# Patient Record
Sex: Male | Born: 1965 | Race: White | Hispanic: No | Smoking: Former smoker
Health system: Southern US, Community
[De-identification: ages and names within clinical notes are randomized; demographics above are authoritative.]

## PROBLEM LIST (undated history)

## (undated) DIAGNOSIS — I10 Essential (primary) hypertension: Secondary | ICD-10-CM

## (undated) DIAGNOSIS — Z860100 Personal history of colon polyps, unspecified: Secondary | ICD-10-CM

## (undated) DIAGNOSIS — K760 Fatty (change of) liver, not elsewhere classified: Secondary | ICD-10-CM

## (undated) DIAGNOSIS — A630 Anogenital (venereal) warts: Secondary | ICD-10-CM

## (undated) DIAGNOSIS — N189 Chronic kidney disease, unspecified: Secondary | ICD-10-CM

## (undated) DIAGNOSIS — G43909 Migraine, unspecified, not intractable, without status migrainosus: Secondary | ICD-10-CM

## (undated) DIAGNOSIS — J40 Bronchitis, not specified as acute or chronic: Secondary | ICD-10-CM

## (undated) DIAGNOSIS — M1990 Unspecified osteoarthritis, unspecified site: Secondary | ICD-10-CM

## (undated) DIAGNOSIS — Z86718 Personal history of other venous thrombosis and embolism: Secondary | ICD-10-CM

## (undated) DIAGNOSIS — T884XXA Failed or difficult intubation, initial encounter: Secondary | ICD-10-CM

## (undated) DIAGNOSIS — E119 Type 2 diabetes mellitus without complications: Secondary | ICD-10-CM

## (undated) HISTORY — DX: Essential (primary) hypertension: I10

## (undated) HISTORY — DX: Anogenital (venereal) warts: A63.0

## (undated) HISTORY — DX: Fatty (change of) liver, not elsewhere classified: K76.0

## (undated) HISTORY — DX: Personal history of colonic polyps: Z86.010

## (undated) HISTORY — PX: POLYPECTOMY: SHX149

## (undated) HISTORY — PX: COLONOSCOPY: SHX174

## (undated) HISTORY — DX: Personal history of colon polyps, unspecified: Z86.0100

## (undated) HISTORY — PX: WISDOM TOOTH EXTRACTION: SHX21

## (undated) HISTORY — DX: Migraine, unspecified, not intractable, without status migrainosus: G43.909

## (undated) HISTORY — DX: Chronic kidney disease, unspecified: N18.9

## (undated) HISTORY — DX: Personal history of other venous thrombosis and embolism: Z86.718

## (undated) HISTORY — DX: Bronchitis, not specified as acute or chronic: J40

## (undated) HISTORY — DX: Type 2 diabetes mellitus without complications: E11.9

## (undated) HISTORY — DX: Unspecified osteoarthritis, unspecified site: M19.90

---

## 2000-10-08 ENCOUNTER — Encounter: Payer: Self-pay | Admitting: Emergency Medicine

## 2000-10-08 ENCOUNTER — Emergency Department (HOSPITAL_COMMUNITY): Admission: EM | Admit: 2000-10-08 | Discharge: 2000-10-08 | Payer: Self-pay | Admitting: Emergency Medicine

## 2001-03-15 ENCOUNTER — Emergency Department (HOSPITAL_COMMUNITY): Admission: EM | Admit: 2001-03-15 | Discharge: 2001-03-15 | Payer: Self-pay | Admitting: Emergency Medicine

## 2001-03-15 ENCOUNTER — Encounter: Payer: Self-pay | Admitting: Emergency Medicine

## 2001-03-17 ENCOUNTER — Emergency Department (HOSPITAL_COMMUNITY): Admission: EM | Admit: 2001-03-17 | Discharge: 2001-03-17 | Payer: Self-pay | Admitting: Emergency Medicine

## 2001-03-23 ENCOUNTER — Encounter: Payer: Self-pay | Admitting: Emergency Medicine

## 2001-03-23 ENCOUNTER — Emergency Department (HOSPITAL_COMMUNITY): Admission: EM | Admit: 2001-03-23 | Discharge: 2001-03-23 | Payer: Self-pay | Admitting: Emergency Medicine

## 2002-11-04 ENCOUNTER — Emergency Department (HOSPITAL_COMMUNITY): Admission: EM | Admit: 2002-11-04 | Discharge: 2002-11-04 | Payer: Self-pay | Admitting: Emergency Medicine

## 2002-11-04 ENCOUNTER — Encounter: Payer: Self-pay | Admitting: Emergency Medicine

## 2003-11-30 ENCOUNTER — Encounter: Admission: RE | Admit: 2003-11-30 | Discharge: 2003-11-30 | Payer: Self-pay | Admitting: Rheumatology

## 2004-09-16 ENCOUNTER — Ambulatory Visit: Payer: Self-pay | Admitting: Family Medicine

## 2005-06-26 ENCOUNTER — Ambulatory Visit: Payer: Self-pay | Admitting: Family Medicine

## 2005-08-04 ENCOUNTER — Ambulatory Visit: Payer: Self-pay | Admitting: Family Medicine

## 2005-08-18 ENCOUNTER — Ambulatory Visit: Payer: Self-pay | Admitting: Family Medicine

## 2005-08-31 ENCOUNTER — Ambulatory Visit: Payer: Self-pay | Admitting: Gastroenterology

## 2005-09-03 ENCOUNTER — Ambulatory Visit: Payer: Self-pay | Admitting: Family Medicine

## 2005-09-03 ENCOUNTER — Encounter: Admission: RE | Admit: 2005-09-03 | Discharge: 2005-09-03 | Payer: Self-pay | Admitting: Family Medicine

## 2005-09-08 ENCOUNTER — Ambulatory Visit: Payer: Self-pay

## 2005-09-11 ENCOUNTER — Ambulatory Visit: Payer: Self-pay | Admitting: Gastroenterology

## 2005-09-11 ENCOUNTER — Encounter (INDEPENDENT_AMBULATORY_CARE_PROVIDER_SITE_OTHER): Payer: Self-pay | Admitting: *Deleted

## 2005-10-20 ENCOUNTER — Ambulatory Visit: Payer: Self-pay | Admitting: *Deleted

## 2005-10-23 ENCOUNTER — Ambulatory Visit: Payer: Self-pay | Admitting: Family Medicine

## 2005-11-06 ENCOUNTER — Ambulatory Visit: Payer: Self-pay | Admitting: Family Medicine

## 2005-11-13 ENCOUNTER — Ambulatory Visit: Payer: Self-pay

## 2006-01-26 ENCOUNTER — Ambulatory Visit: Payer: Self-pay | Admitting: *Deleted

## 2006-01-27 ENCOUNTER — Ambulatory Visit: Payer: Self-pay

## 2006-01-28 ENCOUNTER — Ambulatory Visit (HOSPITAL_COMMUNITY): Admission: RE | Admit: 2006-01-28 | Discharge: 2006-01-28 | Payer: Self-pay | Admitting: Cardiovascular Disease

## 2006-12-14 ENCOUNTER — Ambulatory Visit: Payer: Self-pay | Admitting: Family Medicine

## 2006-12-21 ENCOUNTER — Ambulatory Visit: Payer: Self-pay | Admitting: Family Medicine

## 2006-12-21 LAB — CONVERTED CEMR LAB
ALT: 50 units/L — ABNORMAL HIGH (ref 0–40)
AST: 25 units/L (ref 0–37)
Alkaline Phosphatase: 63 units/L (ref 39–117)
BUN: 9 mg/dL (ref 6–23)
CO2: 29 meq/L (ref 19–32)
Calcium: 9.5 mg/dL (ref 8.4–10.5)
Creatinine, Ser: 1 mg/dL (ref 0.4–1.5)
Potassium: 4.3 meq/L (ref 3.5–5.1)
Total Bilirubin: 1.1 mg/dL (ref 0.3–1.2)
Total Protein: 7 g/dL (ref 6.0–8.3)

## 2006-12-22 ENCOUNTER — Encounter: Payer: Self-pay | Admitting: Family Medicine

## 2010-06-05 ENCOUNTER — Emergency Department (HOSPITAL_COMMUNITY): Admission: EM | Admit: 2010-06-05 | Discharge: 2010-06-05 | Payer: Self-pay | Admitting: Family Medicine

## 2010-09-02 ENCOUNTER — Encounter: Payer: Self-pay | Admitting: Gastroenterology

## 2010-11-04 NOTE — Letter (Signed)
Summary: Colonoscopy Letter  [facility]  [address] [place], [place] [address]   Phone: [phone]  Fax: [phone]      September 02, 2010 MRN: [id number]   Topher Eichel JR [address] [place], [place]  [address]   Dear Mr. TUCKER MINTER,   According to your medical record, it is time for you to schedule a Colonoscopy. The American Cancer Society recommends this procedure as a method to detect early colon cancer. Patients with a family history of colon cancer, or a personal history of colon polyps or inflammatory bowel disease are at increased risk.  This letter has been generated based on the recommendations made at the time of your procedure. If you feel that in your particular situation this may no longer apply, please contact our office.  Please call our office at [phone] to schedule this appointment or to update your records at your earliest convenience.  Thank you for cooperating with [place] to provide you with the very best care possible.   Sincerely,  Barbette Hair. Arlyce Dice, M.D.  [facility] [phone]

## 2012-06-08 ENCOUNTER — Encounter: Payer: Self-pay | Admitting: Gastroenterology

## 2012-07-25 ENCOUNTER — Encounter: Payer: Self-pay | Admitting: Gastroenterology

## 2012-08-25 ENCOUNTER — Encounter: Payer: Self-pay | Admitting: Gastroenterology

## 2012-08-25 ENCOUNTER — Ambulatory Visit (AMBULATORY_SURGERY_CENTER): Payer: 59 | Admitting: *Deleted

## 2012-08-25 VITALS — Ht 71.0 in | Wt 250.0 lb

## 2012-08-25 DIAGNOSIS — Z1211 Encounter for screening for malignant neoplasm of colon: Secondary | ICD-10-CM

## 2012-08-25 DIAGNOSIS — Z800 Family history of malignant neoplasm of digestive organs: Secondary | ICD-10-CM

## 2012-08-25 DIAGNOSIS — Z860100 Personal history of colon polyps, unspecified: Secondary | ICD-10-CM

## 2012-08-25 MED ORDER — NA SULFATE-K SULFATE-MG SULF 17.5-3.13-1.6 GM/177ML PO SOLN: 1.0000 | Freq: Once | ORAL | Status: DC

## 2012-08-25 NOTE — Progress Notes (Signed)
No egg or soy allergy. ewm 

## 2012-09-15 ENCOUNTER — Ambulatory Visit (AMBULATORY_SURGERY_CENTER): Payer: 59 | Admitting: Gastroenterology

## 2012-09-15 ENCOUNTER — Encounter: Payer: Self-pay | Admitting: Gastroenterology

## 2012-09-15 VITALS — BP 142/92 | HR 69 | Temp 97.1°F | Resp 13 | Ht 71.0 in | Wt 250.0 lb

## 2012-09-15 DIAGNOSIS — D126 Benign neoplasm of colon, unspecified: Secondary | ICD-10-CM

## 2012-09-15 DIAGNOSIS — Z800 Family history of malignant neoplasm of digestive organs: Secondary | ICD-10-CM

## 2012-09-15 DIAGNOSIS — Z86010 Personal history of colonic polyps: Secondary | ICD-10-CM

## 2012-09-15 DIAGNOSIS — Z1211 Encounter for screening for malignant neoplasm of colon: Secondary | ICD-10-CM

## 2012-09-15 MED ORDER — SODIUM CHLORIDE 0.9 % IV SOLN: 500.0000 mL | INTRAVENOUS | Status: DC

## 2012-09-15 NOTE — Progress Notes (Signed)
Called to room to assist during endoscopic procedure.  Patient ID and intended procedure confirmed with present staff. Received instructions for my participation in the procedure from the performing physician. ewm 

## 2012-09-15 NOTE — Progress Notes (Signed)
Patient did not experience any of the following events: a burn prior to discharge; a fall within the facility; wrong site/side/patient/procedure/implant event; or a hospital transfer or hospital admission upon discharge from the facility. (G8907) Patient did not have preoperative order for IV antibiotic SSI prophylaxis. ([id number])  

## 2012-09-15 NOTE — Patient Instructions (Addendum)

## 2012-09-15 NOTE — Progress Notes (Signed)
1610 a/ox3 pleased report to [facility]

## 2012-09-15 NOTE — Op Note (Signed)
[facility] [address] [place], [address]   COLONOSCOPY PROCEDURE REPORT  PATIENT: Jeremy Dean, Jeremy Dean  MR#: [id number] BIRTHDATE: [birth year] , 46  yrs. old GENDER: Male ENDOSCOPIST: Louis Meckel, MD REFERRED BY: PROCEDURE DATE:  09/15/2012 PROCEDURE:   Colonoscopy with snare polypectomy ASA CLASS:   Class II INDICATIONS:Patient's immediate family history of colon cancer. MEDICATIONS: MAC sedation, administered by CRNA and Propofol (Diprivan) 340 mg IV  DESCRIPTION OF PROCEDURE:   After the risks benefits and alternatives of the procedure were thoroughly explained, informed consent was obtained.  A digital rectal exam revealed no abnormalities of the rectum.   The LB [id number]  endoscope was introduced through the anus and advanced to the cecum, which was identified by both the appendix and ileocecal valve. No adverse events experienced.   The quality of the prep was Suprep excellent The instrument was then slowly withdrawn as the colon was fully examined.      COLON FINDINGS: A sessile polyp measuring 5 mm in size was found in the ascending colon.  A polypectomy was performed with a cold snare.  The resection was complete and the polyp tissue was completely retrieved.   Two sessile polyps ranging between 3-57mm in size were found in the sigmoid colon.  A polypectomy was performed with a cold snare.  The resection was complete and the polyp tissue was completely retrieved.   The colon mucosa was otherwise normal. Retroflexed views revealed no abnormalities. The time to cecum=2 minutes 24 seconds.  Withdrawal time=10 minutes 0 seconds.  The scope was withdrawn and the procedure completed. COMPLICATIONS: There were no complications.  ENDOSCOPIC IMPRESSION: 1.   Sessile polyp measuring 5 mm in size was found in the ascending colon; polypectomy was performed with a cold snare 2.   Two sessile polyps ranging between 3-26mm in size were found in the  sigmoid colon; polypectomy was performed with a cold snare 3.   The colon mucosa was otherwise normal  RECOMMENDATIONS: Given your significant family history of colon cancer, you should have a repeat colonoscopy in 5 years   eSigned:  Louis Meckel, MD 09/15/2012 8:43 AM   cc: Lelon Perla, DO and Bernadette Hoit, MD   PATIENT NAME:  Jeremy Dean, Jeremy Dean MR#: [id number]

## 2012-09-16 ENCOUNTER — Telehealth: Payer: Self-pay

## 2012-09-16 NOTE — Telephone Encounter (Signed)
  Follow up Call-  Call back number 09/15/2012  Post procedure Call Back phone  # [phone]  Permission to leave phone message Yes     Patient questions:  Do you have a fever, pain , or abdominal swelling? no Pain Score  0 *  Have you tolerated food without any problems? yes  Have you been able to return to your normal activities? yes  Do you have any questions about your discharge instructions: Diet   no Medications  no Follow up visit  no  Do you have questions or concerns about your Care? no  Actions: * If pain score is 4 or above: No action needed, pain <4.

## 2012-09-21 ENCOUNTER — Encounter: Payer: Self-pay | Admitting: Gastroenterology

## 2013-08-04 ENCOUNTER — Telehealth: Payer: Self-pay

## 2013-08-04 NOTE — Telephone Encounter (Addendum)
Left message for call back  identifiable  [facility]--2013--Dr Kaplan--next due 2018--FH Unable to reach prior to visit

## 2013-08-07 ENCOUNTER — Ambulatory Visit (INDEPENDENT_AMBULATORY_CARE_PROVIDER_SITE_OTHER): Payer: Managed Care, Other (non HMO) | Admitting: Family Medicine

## 2013-08-07 ENCOUNTER — Encounter: Payer: Self-pay | Admitting: Family Medicine

## 2013-08-07 VITALS — BP 128/82 | HR 75 | Temp 98.8°F | Ht 70.5 in | Wt 233.0 lb

## 2013-08-07 DIAGNOSIS — Z0000 Encounter for general adult medical examination without abnormal findings: Secondary | ICD-10-CM

## 2013-08-07 DIAGNOSIS — M25559 Pain in unspecified hip: Secondary | ICD-10-CM

## 2013-08-07 DIAGNOSIS — M25551 Pain in right hip: Secondary | ICD-10-CM

## 2013-08-07 DIAGNOSIS — E669 Obesity, unspecified: Secondary | ICD-10-CM | POA: Insufficient documentation

## 2013-08-07 MED ORDER — MELOXICAM 15 MG PO TABS: 15.0000 mg | ORAL_TABLET | Freq: Every day | ORAL | Status: DC

## 2013-08-07 NOTE — Progress Notes (Signed)
Subjective:    Patient ID: Jeremy Dean, male    DOB: 1966/03/05, 47 y.o.   MRN: [id number]  HPI Pt here for cpe and is c/o R  Hip pain x 9 months.  Any running , basketball etc aggravates it.  Stretching helps but not completely.  Pt has taken Ibuprofen before playing and it helps some.     Review of Systems Review of Systems  Constitutional: Negative for activity change, appetite change and fatigue.  HENT: Negative for hearing loss, congestion, tinnitus and ear discharge.  dentist q 1-2 y Eyes: Negative for visual disturbance (see opth--   Due).  Respiratory: Negative for cough, chest tightness and shortness of breath.   Cardiovascular: Negative for chest pain, palpitations and leg swelling.  Gastrointestinal: Negative for abdominal pain, diarrhea, constipation and abdominal distention.  Genitourinary: Negative for urgency, frequency, decreased urine volume and difficulty urinating.  Musculoskeletal: Negative for back pain, arthralgias and gait problem.  Skin: Negative for color change, pallor and rash.  Neurological: Negative for dizziness, light-headedness, numbness and headaches.  Hematological: Negative for adenopathy. Does not bruise/bleed easily.  Psychiatric/Behavioral: Negative for suicidal ideas, confusion, sleep disturbance, self-injury, dysphoric mood, decreased concentration and agitation.   Past Medical History  Diagnosis Date  . Hx of colonic polyps   . Hypertension   . Chronic kidney disease     kidney stone  . History of blood clots     left leg after fracture 20+ years ago  . Genital warts     Hx of Genital warts   History  Substance Use Topics  . Smoking status: Former Games developer  . Smokeless tobacco: Never Used  . Alcohol Use: Yes     Comment: socially   Family History  Problem Relation Age of Onset  . Colon cancer Father   . Esophageal cancer Father   . Breast cancer Maternal Aunt   . Colon cancer Maternal Uncle   . Rectal cancer Neg Hx   . Stomach  cancer Neg Hx   . Lung cancer Paternal Grandfather   . Breast cancer Paternal Aunt   . Stroke Maternal Grandfather    Current outpatient prescriptions:ibuprofen (ADVIL,MOTRIN) 200 MG tablet, Take 200 mg by mouth every 6 (six) hours as needed., Disp: , Rfl:  Past Surgical History  Procedure Laterality Date  . Wisdom tooth extraction    . Colonoscopy    . Polypectomy           Objective:   Physical Exam  BP 128/82  Pulse 75  Temp(Src) 98.8 F (37.1 C) (Oral)  Ht 5' 10.5" (1.791 m)  Wt 233 lb (105.688 kg)  BMI 32.95 kg/m2  SpO2 96% General appearance: alert, cooperative, appears stated age and no distress Head: Normocephalic, without obvious abnormality, atraumatic Eyes: conjunctivae/corneas clear. PERRL, EOM's intact. Fundi benign. Ears: normal TM's and external ear canals both ears Nose: Nares normal. Septum midline. Mucosa normal. No drainage or sinus tenderness. Throat: lips, mucosa, and tongue normal; teeth and gums normal Neck: no adenopathy, no carotid bruit, no JVD, supple, symmetrical, trachea midline and thyroid not enlarged, symmetric, no tenderness/mass/nodules Back: symmetric, no curvature. ROM normal. No CVA tenderness. Lungs: clear to auscultation bilaterally Chest wall: no tenderness Heart: regular rate and rhythm, S1, S2 normal, no murmur, click, rub or gallop Abdomen: soft, non-tender; bowel sounds normal; no masses,  no organomegaly Male genitalia: normal Rectal: normal tone, normal prostate, no masses or tenderness and soft brown guaiac negative stool noted Extremities: extremities normal, atraumatic, no  cyanosis or edema Pulses: 2+ and symmetric Skin: Skin color, texture, turgor normal. No rashes or lesions Lymph nodes: Cervical, supraclavicular, and axillary nodes normal. Neurologic: Alert and oriented X 3, normal strength and tone. Normal symmetric reflexes. Normal coordination and gait Psych- no depression, no anxiety       Assessment & Plan:   cpe-- ghm utc          Check labs          See avs

## 2013-08-07 NOTE — Patient Instructions (Signed)
Preventive Care for Adults, Male  A healthy lifestyle and preventive care can promote health and wellness. Preventive health guidelines for men include the following key practices:  · A routine yearly physical is a good way to check with your caregiver about your health and preventative screening. It is a chance to share any concerns and updates on your health, and to receive a thorough exam.  · Visit your dentist for a routine exam and preventative care every 6 months. Brush your teeth twice a day and floss once a day. Good oral hygiene prevents tooth decay and gum disease.  · The frequency of eye exams is based on your age, health, family medical history, use of contact lenses, and other factors. Follow your caregiver's recommendations for frequency of eye exams.  · Eat a healthy diet. Foods like vegetables, fruits, whole grains, low-fat dairy products, and lean protein foods contain the nutrients you need without too many calories. Decrease your intake of foods high in solid fats, added sugars, and salt. Eat the right amount of calories for you. Get information about a proper diet from your caregiver, if necessary.  · Regular physical exercise is one of the most important things you can do for your health. Most adults should get at least 150 minutes of moderate-intensity exercise (any activity that increases your heart rate and causes you to sweat) each week. In addition, most adults need muscle-strengthening exercises on 2 or more days a week.  · Maintain a healthy weight. The body mass index (BMI) is a screening tool to identify possible weight problems. It provides an estimate of body fat based on height and weight. Your caregiver can help determine your BMI, and can help you achieve or maintain a healthy weight. For adults 20 years and older:  · A BMI below 18.5 is considered underweight.  · A BMI of 18.5 to 24.9 is normal.  · A BMI of 25 to 29.9 is considered overweight.  · A BMI of 30 and above is  considered obese.  · Maintain normal blood lipids and cholesterol levels by exercising and minimizing your intake of saturated fat. Eat a balanced diet with plenty of fruit and vegetables. Blood tests for lipids and cholesterol should begin at age 20 and be repeated every 5 years. If your lipid or cholesterol levels are high, you are over 50, or you are a high risk for heart disease, you may need your cholesterol levels checked more frequently. Ongoing high lipid and cholesterol levels should be treated with medicines if diet and exercise are not effective.  · If you smoke, find out from your caregiver how to quit. If you do not use tobacco, do not start.  · If you choose to drink alcohol, do not exceed 2 drinks per day. One drink is considered to be 12 ounces (355 mL) of beer, 5 ounces (148 mL) of wine, or 1.5 ounces (44 mL) of liquor.  · Avoid use of street drugs. Do not share needles with anyone. Ask for help if you need support or instructions about stopping the use of drugs.  · High blood pressure causes heart disease and increases the risk of stroke. Your blood pressure should be checked at least every 1 to 2 years. Ongoing high blood pressure should be treated with medicines, if weight loss and exercise are not effective.  · If you are 45 to 47 years old, ask your caregiver if you should take aspirin to prevent heart disease.  · Diabetes screening involves taking   a blood sample to check your fasting blood sugar level. This should be done once every 3 years, after age 45, if you are within normal weight and without risk factors for diabetes. Testing should be considered at a younger age or be carried out more frequently if you are overweight and have at least 1 risk factor for diabetes.  · Colorectal cancer can be detected and often prevented. Most routine colorectal cancer screening begins at the age of 50 and continues through age 75. However, your caregiver may recommend screening at an earlier age if you  have risk factors for colon cancer. On a yearly basis, your caregiver may provide home test kits to check for hidden blood in the stool. Use of a small camera at the end of a tube, to directly examine the colon (sigmoidoscopy or colonoscopy), can detect the earliest forms of colorectal cancer. Talk to your caregiver about this at age 50, when routine screening begins.  Direct examination of the colon should be repeated every 5 to 10 years through age 75, unless early forms of pre-cancerous polyps or small growths are found.  · Hepatitis C blood testing is recommended for all people born from 1945 through 1965 and any individual with known risks for hepatitis C.  · Practice safe sex. Use condoms and avoid high-risk sexual practices to reduce the spread of sexually transmitted infections (STIs). STIs include gonorrhea, chlamydia, syphilis, trichomonas, herpes, HPV, and human immunodeficiency virus (HIV). Herpes, HIV, and HPV are viral illnesses that have no cure. They can result in disability, cancer, and death.  · A one-time screening for abdominal aortic aneurysm (AAA) and surgical repair of large AAAs by sound wave imaging (ultrasonography) is recommended for ages 65 to 75 years who are current or former smokers.  · Healthy men should no longer receive prostate-specific antigen (PSA) blood tests as part of routine cancer screening. Consult with your caregiver about prostate cancer screening.  · Testicular cancer screening is not recommended for adult males who have no symptoms. Screening includes self-exam, caregiver exam, and other screening tests. Consult with your caregiver about any symptoms you have or any concerns you have about testicular cancer.  · Use sunscreen with skin protection factor (SPF) of 30 or more. Apply sunscreen liberally and repeatedly throughout the day. You should seek shade when your shadow is shorter than you. Protect yourself by wearing long sleeves, pants, a wide-brimmed hat, and  sunglasses year round, whenever you are outdoors.  · Once a month, do a whole body skin exam, using a mirror to look at the skin on your back. Notify your caregiver of new moles, moles that have irregular borders, moles that are larger than a pencil eraser, or moles that have changed in shape or color.  · Stay current with required immunizations.  · Influenza. You need a dose every fall (or winter). The composition of the flu vaccine changes each year, so being vaccinated once is not enough.  · Pneumococcal polysaccharide. You need 1 to 2 doses if you smoke cigarettes or if you have certain chronic medical conditions. You need 1 dose at age 65 (or older) if you have never been vaccinated.  · Tetanus, diphtheria, pertussis (Tdap, Td). Get 1 dose of Tdap vaccine if you are younger than age 65 years, are over 65 and have contact with an infant, are a healthcare worker, or simply want to be protected from whooping cough. After that, you need a Td booster dose every 10 years. Consult your   caregiver if you have not had at least 3 tetanus and diphtheria-containing shots sometime in your life or have a deep or dirty wound.  · HPV. This vaccine is recommended for males 13 through 47 years of age. This vaccine may be given to men 22 through 47 years of age who have not completed the 3 dose series. It is recommended for men through age 26 who have sex with men or whose immune system is weakened because of HIV infection, other illness, or medications. The vaccine is given in 3 doses over 6 months.  · Measles, mumps, rubella (MMR). You need at least 1 dose of MMR if you were born in 1957 or later. You may also need a 2nd dose.  · Meningococcal. If you are age 19 to 21 years and a first-year college student living in a residence hall, or have one of several medical conditions, you need to get vaccinated against meningococcal disease. You may also need additional booster doses.  · Zoster (shingles). If you are age 60 years or  older, you should get this vaccine.  · Varicella (chickenpox). If you have never had chickenpox or you were vaccinated but received only 1 dose, talk to your caregiver to find out if you need this vaccine.  · Hepatitis A. You need this vaccine if you have a specific risk factor for hepatitis A virus infection, or you simply wish to be protected from this disease. The vaccine is usually given as 2 doses, 6 to 18 months apart.  · Hepatitis B. You need this vaccine if you have a specific risk factor for hepatitis B virus infection or you simply wish to be protected from this disease. The vaccine is given in 3 doses, usually over 6 months.  Preventative Service / Frequency  Ages 19 to 39  · Blood pressure check.** / Every 1 to 2 years.  · Lipid and cholesterol check.** / Every 5 years beginning at age 20.  · Hepatitis C blood test.** / For any individual with known risks for hepatitis C.  · Skin self-exam. / Monthly.  · Influenza immunization.** / Every year.  · Pneumococcal polysaccharide immunization.** / 1 to 2 doses if you smoke cigarettes or if you have certain chronic medical conditions.  · Tetanus, diphtheria, pertussis (Tdap,Td) immunization. / A one-time dose of Tdap vaccine. After that, you need a Td booster dose every 10 years.  · HPV immunization. / 3 doses over 6 months, if 26 and younger.  · Measles, mumps, rubella (MMR) immunization. / You need at least 1 dose of MMR if you were born in 1957 or later. You may also need a 2nd dose.  · Meningococcal immunization. / 1 dose if you are age 19 to 21 years and a first-year college student living in a residence hall, or have one of several medical conditions, you need to get vaccinated against meningococcal disease. You may also need additional booster doses.  · Varicella immunization.** / Consult your caregiver.  · Hepatitis A immunization.** / Consult your caregiver. 2 doses, 6 to 18 months apart.  · Hepatitis B immunization.** / Consult your caregiver. 3 doses  usually over 6 months.  Ages 40 to 64  · Blood pressure check.** / Every 1 to 2 years.  · Lipid and cholesterol check.** / Every 5 years beginning at age 20.  · Fecal occult blood test (FOBT) of stool. / Every year beginning at age 50 and continuing until age 75. You may not have   to do this test if you get colonoscopy every 10 years.  · Flexible sigmoidoscopy** or colonoscopy.** / Every 5 years for a flexible sigmoidoscopy or every 10 years for a colonoscopy beginning at age 50 and continuing until age 75.  · Hepatitis C blood test.** / For all people born from 1945 through 1965 and any individual with known risks for hepatitis C.  · Skin self-exam. / Monthly.  · Influenza immunization.** / Every year.  · Pneumococcal polysaccharide immunization.** / 1 to 2 doses if you smoke cigarettes or if you have certain chronic medical conditions.  · Tetanus, diphtheria, pertussis (Tdap/Td) immunization.** / A one-time dose of Tdap vaccine. After that, you need a Td booster dose every 10 years.  · Measles, mumps, rubella (MMR) immunization.  / You need at least 1 dose of MMR if you were born in 1957 or later. You may also need a 2nd dose.  · Varicella immunization.**/ Consult your caregiver.  · Meningococcal immunization.** / Consult your caregiver.  · Hepatitis A immunization.** / Consult your caregiver. 2 doses, 6 to 18 months apart.  · Hepatitis B immunization.** / Consult your caregiver. 3 doses, usually over 6 months.  Ages 65 and over  · Blood pressure check.** / Every 1 to 2 years.  · Lipid and cholesterol check.**/ Every 5 years beginning at age 20.  · Fecal occult blood test (FOBT) of stool. / Every year beginning at age 50 and continuing until age 75. You may not have to do this test if you get colonoscopy every 10 years.  · Flexible sigmoidoscopy** or colonoscopy.** / Every 5 years for a flexible sigmoidoscopy or every 10 years for a colonoscopy beginning at age 50 and continuing until age 75.  · Hepatitis C blood  test.** / For all people born from 1945 through 1965 and any individual with known risks for hepatitis C.  · Abdominal aortic aneurysm (AAA) screening.** / A one-time screening for ages 65 to 75 years who are current or former smokers.  · Skin self-exam. / Monthly.  · Influenza immunization.** / Every year.  · Pneumococcal polysaccharide immunization.** / 1 dose at age 65 (or older) if you have never been vaccinated.  · Tetanus, diphtheria, pertussis (Tdap, Td) immunization. / A one-time dose of Tdap vaccine if you are over 65 and have contact with an infant, are a healthcare worker, or simply want to be protected from whooping cough. After that, you need a Td booster dose every 10 years.  · Varicella immunization. ** / Consult your caregiver.  · Meningococcal immunization.** / Consult your caregiver.  · Hepatitis A immunization. ** / Consult your caregiver. 2 doses, 6 to 18 months apart.  · Hepatitis B immunization.** / Check with your caregiver. 3 doses, usually over 6 months.  **Family history and personal history of risk and conditions may change your caregiver's recommendations.  Document Released: 11/17/2001 Document Revised: 12/14/2011 Document Reviewed: 02/16/2011  [employer]® Patient Information ©2014 [employer].

## 2013-08-07 NOTE — Assessment & Plan Note (Signed)
R hip pain-- mobic 15 mg qd  If no improvement --- refer to sports med

## 2013-08-08 ENCOUNTER — Other Ambulatory Visit (INDEPENDENT_AMBULATORY_CARE_PROVIDER_SITE_OTHER): Payer: Managed Care, Other (non HMO)

## 2013-08-08 DIAGNOSIS — Z0000 Encounter for general adult medical examination without abnormal findings: Secondary | ICD-10-CM

## 2013-08-08 LAB — HEPATIC FUNCTION PANEL
AST: 17 U/L (ref 0–37)
Alkaline Phosphatase: 65 U/L (ref 39–117)
Total Bilirubin: 1.1 mg/dL (ref 0.3–1.2)

## 2013-08-08 LAB — CBC WITH DIFFERENTIAL/PLATELET
Basophils Absolute: 0.1 10*3/uL (ref 0.0–0.1)
Eosinophils Absolute: 0.1 10*3/uL (ref 0.0–0.7)
Lymphocytes Relative: 27.2 % (ref 12.0–46.0)
Monocytes Relative: 8.5 % (ref 3.0–12.0)
Platelets: 213 10*3/uL (ref 150.0–400.0)
RDW: 12.7 % (ref 11.5–14.6)

## 2013-08-08 LAB — BASIC METABOLIC PANEL
BUN: 12 mg/dL (ref 6–23)
Calcium: 9.2 mg/dL (ref 8.4–10.5)
Creatinine, Ser: 1.1 mg/dL (ref 0.4–1.5)
GFR: 76.12 mL/min (ref >60.00)
Glucose, Bld: 96 mg/dL (ref 70–99)

## 2013-08-08 LAB — PSA: PSA: 1.13 ng/mL (ref 0.10–4.00)

## 2013-08-08 LAB — LIPID PANEL
Cholesterol: 175 mg/dL (ref 0–200)
HDL: 34.8 mg/dL — ABNORMAL LOW (ref >39.00)
LDL Cholesterol: 109 mg/dL — ABNORMAL HIGH (ref 0–99)
VLDL: 30.8 mg/dL (ref 0.0–40.0)

## 2013-08-09 LAB — POCT URINALYSIS DIPSTICK
Protein, UA: NEGATIVE
Spec Grav, UA: 1.02
Urobilinogen, UA: 0.2
pH, UA: 6.5

## 2013-09-18 ENCOUNTER — Other Ambulatory Visit: Payer: Self-pay | Admitting: Family Medicine

## 2013-09-18 ENCOUNTER — Encounter: Payer: Self-pay | Admitting: Family Medicine

## 2013-09-18 DIAGNOSIS — M25551 Pain in right hip: Secondary | ICD-10-CM

## 2013-11-02 ENCOUNTER — Emergency Department (HOSPITAL_COMMUNITY): Payer: Managed Care, Other (non HMO)

## 2013-11-02 ENCOUNTER — Encounter (HOSPITAL_COMMUNITY): Payer: Self-pay | Admitting: Emergency Medicine

## 2013-11-02 ENCOUNTER — Emergency Department (HOSPITAL_COMMUNITY)
Admission: EM | Admit: 2013-11-02 | Discharge: 2013-11-02 | Disposition: A | Discharge status: Home / Self Care | Payer: Managed Care, Other (non HMO) | Attending: Emergency Medicine | Admitting: Emergency Medicine

## 2013-11-02 DIAGNOSIS — S298XXA Other specified injuries of thorax, initial encounter: Secondary | ICD-10-CM | POA: Insufficient documentation

## 2013-11-02 DIAGNOSIS — IMO0002 Reserved for concepts with insufficient information to code with codable children: Secondary | ICD-10-CM | POA: Insufficient documentation

## 2013-11-02 DIAGNOSIS — I129 Hypertensive chronic kidney disease with stage 1 through stage 4 chronic kidney disease, or unspecified chronic kidney disease: Secondary | ICD-10-CM | POA: Insufficient documentation

## 2013-11-02 DIAGNOSIS — Y92838 Other recreation area as the place of occurrence of the external cause: Secondary | ICD-10-CM

## 2013-11-02 DIAGNOSIS — R0789 Other chest pain: Secondary | ICD-10-CM

## 2013-11-02 DIAGNOSIS — N189 Chronic kidney disease, unspecified: Secondary | ICD-10-CM | POA: Insufficient documentation

## 2013-11-02 DIAGNOSIS — Z87442 Personal history of urinary calculi: Secondary | ICD-10-CM | POA: Insufficient documentation

## 2013-11-02 DIAGNOSIS — X500XXA Overexertion from strenuous movement or load, initial encounter: Secondary | ICD-10-CM | POA: Insufficient documentation

## 2013-11-02 DIAGNOSIS — Z860100 Personal history of colon polyps, unspecified: Secondary | ICD-10-CM | POA: Insufficient documentation

## 2013-11-02 DIAGNOSIS — Y9239 Other specified sports and athletic area as the place of occurrence of the external cause: Secondary | ICD-10-CM | POA: Insufficient documentation

## 2013-11-02 DIAGNOSIS — S299XXA Unspecified injury of thorax, initial encounter: Secondary | ICD-10-CM

## 2013-11-02 DIAGNOSIS — W219XXA Striking against or struck by unspecified sports equipment, initial encounter: Secondary | ICD-10-CM | POA: Insufficient documentation

## 2013-11-02 DIAGNOSIS — Z87891 Personal history of nicotine dependence: Secondary | ICD-10-CM | POA: Insufficient documentation

## 2013-11-02 DIAGNOSIS — Z8619 Personal history of other infectious and parasitic diseases: Secondary | ICD-10-CM | POA: Insufficient documentation

## 2013-11-02 DIAGNOSIS — Y9367 Activity, basketball: Secondary | ICD-10-CM | POA: Insufficient documentation

## 2013-11-02 MED ORDER — HYDROCODONE-ACETAMINOPHEN 5-325 MG PO TABS: 1.0000 | ORAL_TABLET | ORAL | Status: DC | PRN

## 2013-11-02 NOTE — Discharge Instructions (Signed)
Ice and heat to the area. Continue indocin for inflammation. Take norco as prescribed as needed for severe pain. Follow up with your doctor for recheck. Return if pain worsening.     Blunt Chest Trauma Blunt chest trauma is an injury caused by a blow to the chest. These chest injuries can be very painful. Blunt chest trauma often results in bruised or broken (fractured) ribs. Most cases of bruised and fractured ribs from blunt chest traumas get better after 1 to 3 weeks of rest and pain medicine. Often, the soft tissue in the chest wall is also injured, causing pain and bruising. Internal organs, such as the heart and lungs, may also be injured. Blunt chest trauma can lead to serious medical problems. This injury requires immediate medical care. CAUSES   Motor vehicle collisions.  Falls.  Physical violence.  Sports injuries. SYMPTOMS   Chest pain. The pain may be worse when you move or breathe deeply.  Shortness of breath.  Lightheadedness.  Bruising.  Tenderness.  Swelling. DIAGNOSIS  Your caregiver will do a physical exam. X-rays may be taken to look for fractures. However, minor rib fractures may not show up on X-rays until a few days after the injury. If a more serious injury is suspected, further imaging tests may be done. This may include ultrasounds, computed tomography (CT) scans, or magnetic resonance imaging (MRI). TREATMENT  Treatment depends on the severity of your injury. Your caregiver may prescribe pain medicines and deep breathing exercises. HOME CARE INSTRUCTIONS  Limit your activities until you can move around without much pain.  Do not do any strenuous work until your injury is healed.  Put ice on the injured area.  Put ice in a plastic bag.  Place a towel between your skin and the bag.  Leave the ice on for 15-20 minutes, 03-04 times a day.  You may wear a rib belt as directed by your caregiver to reduce pain.  Practice deep breathing as directed by  your caregiver to keep your lungs clear.  Only take over-the-counter or prescription medicines for pain, fever, or discomfort as directed by your caregiver. SEEK IMMEDIATE MEDICAL CARE IF:   You have increasing pain or shortness of breath.  You cough up blood.  You have nausea, vomiting, or abdominal pain.  You have a fever.  You feel dizzy, weak, or you faint. MAKE SURE YOU:  Understand these instructions.  Will watch your condition.  Will get help right away if you are not doing well or get worse. Document Released: 10/29/2004 Document Revised: 12/14/2011 Document Reviewed: 07/08/2011 [facility] Patient Information 2014 [place].

## 2013-11-02 NOTE — ED Notes (Addendum)
Pt reports last night he ran into someone playing basketball. Was hit on the left side he reports "his left side got collapsed in" pt reports he felt something pop. Pain on left side of chest at present 6/10. Reports he has broken his collar bone, does not think he broke his collar bone. Pain increases with movement at present 6/10.   Reports hx of HTN, but at check up in last 6 months and was healthy and did not need HTN meds at that time.

## 2013-11-02 NOTE — ED Provider Notes (Signed)
CSN: [id number]     Arrival date & time 11/02/13  0973 History   First MD Initiated Contact with Patient 11/02/13 1017     Chief Complaint  Patient presents with  . Chest Pain  . sports injury last night    (Consider location/radiation/quality/duration/timing/severity/associated sxs/prior Treatment) HPI Jeremy Dean. is a 48 y.o. male who presents to ED with complaint of left sided chest pain. Pt states he was playing basketball yesterday when he collided with another player. States he was hit on the right side, however, states due to the impact, his left side chest and shoulder area "got compressed in" and he felt a pop. States he was able to play few more minutes after the incident however it was painful so he had to stop. Patient states that he has had pain since then. States he couldn't sleep at night because of the pain. He did not take any medications prior to coming in. He denies any shortness of breath. He states it hurts for him to take a deep breath and move his left shoulder. Being still makes pain better. He denies any specific pain at the shoulder joint, states it just hurts in his chest and upper back. He denies any other complaints.  Past Medical History  Diagnosis Date  . Hx of colonic polyps   . Hypertension   . Chronic kidney disease     kidney stone  . History of blood clots     left leg after fracture 20+ years ago  . Genital warts     Hx of Genital warts   Past Surgical History  Procedure Laterality Date  . Wisdom tooth extraction    . Colonoscopy    . Polypectomy     Family History  Problem Relation Age of Onset  . Colon cancer Father   . Esophageal cancer Father   . Breast cancer Maternal Aunt   . Colon cancer Maternal Uncle   . Rectal cancer Neg Hx   . Stomach cancer Neg Hx   . Lung cancer Paternal Grandfather   . Breast cancer Paternal Aunt   . Stroke Maternal Grandfather    History  Substance Use Topics  . Smoking status: Former Smoker -- 2.00  packs/day for 4 years    Types: Cigarettes  . Smokeless tobacco: Never Used  . Alcohol Use: Yes     Comment: socially    Review of Systems  Constitutional: Negative for fever and chills.  Respiratory: Positive for chest tightness. Negative for cough and shortness of breath.   Cardiovascular: Positive for chest pain. Negative for palpitations and leg swelling.  Gastrointestinal: Negative for nausea, vomiting, abdominal pain, diarrhea and abdominal distention.  Genitourinary: Negative for urgency and hematuria.  Musculoskeletal: Positive for arthralgias and back pain. Negative for myalgias, neck pain and neck stiffness.  Skin: Negative for rash.  Allergic/Immunologic: Negative for immunocompromised state.  Neurological: Negative for dizziness, weakness, light-headedness, numbness and headaches.    Allergies  Review of patient's allergies indicates no known allergies.  Home Medications   Current Outpatient Rx  Name  Route  Sig  Dispense  Refill  . acetaminophen (TYLENOL) 325 MG tablet   Oral   Take 650 mg by mouth every 6 (six) hours as needed.         . indomethacin (INDOCIN) 25 MG capsule   Oral   Take 50 mg by mouth every 12 (twelve) hours.           BP 142/106  Pulse 72  Temp(Src) 98.4 F (36.9 C) (Oral)  Resp 16  SpO2 98% Physical Exam  Nursing note and vitals reviewed. Constitutional: He appears well-developed and well-nourished. No distress.  HENT:  Head: Normocephalic and atraumatic.  Eyes: Conjunctivae are normal.  Neck: Neck supple.  Cardiovascular: Normal rate, regular rhythm and normal heart sounds.   Pulmonary/Chest: Effort normal. No respiratory distress. He has no wheezes. He has no rales. He exhibits tenderness.  Tenderness over anterior left  Upper sternocolstal joints. Tenderness extends into left upper ribs.   Abdominal: Soft. Bowel sounds are normal. He exhibits no distension. There is no tenderness. There is no rebound.  Musculoskeletal: He  exhibits no edema.  Pain with ROM of left shoulder. Full ROM of the shoulder. No GH joint tenderness. Clavicle non tender.   Neurological: He is alert.  Skin: Skin is warm and dry.    ED Course  Procedures (including critical care time) Labs Review Labs Reviewed - No data to display Imaging Review Dg Ribs Unilateral W/chest Left  11/02/2013   CLINICAL DATA:  CHEST PAIN status post basketball injury  EXAM: LEFT RIBS AND CHEST - 3+ VIEW  COMPARISON:  None.  FINDINGS: No fracture or other bone lesions are seen involving the ribs. There is no evidence of pneumothorax or pleural effusion. Both lungs are clear. Heart size and mediastinal contours are within normal limits.  IMPRESSION: Negative.   Electronically Signed   By: Kathreen Devoid   On: 11/02/2013 11:14    EKG Interpretation   None       MDM   1. Sternocostal pain   2. Chest wall injury     Patient sports injury yesterday, now with left-sided chest pain that is tender to palpation in her produced with movement. There is no deformity, bruising, crepitus on exam. Area is tender to palpation. X-rays obtained to rule out pneumothorax and rib fractures and are negative. Discussed results with patient. Based on exam I suspect a sternocostal joint sprain.  Patient already taking indomethacin for her head pain, continue that and we'll add Norco just for breakthrough pain. I offered patient a sling but he did not want one. He'll followup with his Dr.  Danley Danker Vitals:   11/02/13 1009  BP: 142/106  Pulse: 72  Temp: 98.4 F (36.9 C)  TempSrc: Oral  Resp: 16  SpO2: 98%      Jeremy Dean A Keirston Saephanh, PA-C 11/02/13 1144

## 2013-11-02 NOTE — ED Notes (Signed)
Patient transported to X-ray 

## 2013-11-05 NOTE — ED Provider Notes (Signed)
Medical screening examination/treatment/procedure(s) were conducted as a shared visit with non-physician practitioner(s) and myself.  I personally evaluated the patient during the encounter.  EKG Interpretation    Date/Time:  Thursday November 02 2013 10:07:47 EST Ventricular Rate:  76 PR Interval:  153 QRS Duration: 96 QT Interval:  370 QTC Calculation: 416 R Axis:   9 Text Interpretation:  Sinus rhythm ST elev, probable normal early repol pattern ED PHYSICIAN INTERPRETATION AVAILABLE IN [facility] Confirmed by TEST, RECORD ([id number]) on 11/04/2013 2:50:03 PM            S/p recent chest wall contusion. +chest wall tenderness. No crepitus. symm excursion. Cxr.   Mirna Mires, MD 11/05/13 [phone]

## 2015-10-29 ENCOUNTER — Other Ambulatory Visit: Payer: Self-pay | Admitting: Family Medicine

## 2015-10-29 ENCOUNTER — Encounter: Payer: Self-pay | Admitting: Family Medicine

## 2015-10-29 ENCOUNTER — Ambulatory Visit (INDEPENDENT_AMBULATORY_CARE_PROVIDER_SITE_OTHER): Payer: PRIVATE HEALTH INSURANCE | Admitting: Family Medicine

## 2015-10-29 VITALS — BP 114/82 | HR 78 | Temp 98.7°F | Ht 71.0 in | Wt 250.2 lb

## 2015-10-29 DIAGNOSIS — M25551 Pain in right hip: Secondary | ICD-10-CM

## 2015-10-29 DIAGNOSIS — G8929 Other chronic pain: Secondary | ICD-10-CM

## 2015-10-29 MED ORDER — MELOXICAM 15 MG PO TABS: 15.0000 mg | ORAL_TABLET | Freq: Every day | ORAL | Status: DC

## 2015-10-29 NOTE — Progress Notes (Signed)
Patient ID: Jeremy Lindy., male    DOB: 04-25-1966  Age: 50 y.o. MRN: [id number]    Subjective:  Subjective HPI Jeremy Dean. presents for f/u R hip pain.  He had seen ortho but its been 2 years so he needs a new referral.  No new injury.  Its been getting worse.    Review of Systems  Constitutional: Negative for diaphoresis, appetite change, fatigue and unexpected weight change.  Eyes: Negative for pain, redness and visual disturbance.  Respiratory: Negative for cough, chest tightness, shortness of breath and wheezing.   Cardiovascular: Negative for chest pain, palpitations and leg swelling.  Endocrine: Negative for cold intolerance, heat intolerance, polydipsia, polyphagia and polyuria.  Genitourinary: Negative for dysuria, frequency and difficulty urinating.  Musculoskeletal: Positive for arthralgias. Negative for back pain and gait problem.  Neurological: Negative for dizziness, light-headedness, numbness and headaches.    History Past Medical History  Diagnosis Date  . Hx of colonic polyps   . Hypertension   . Chronic kidney disease     kidney stone  . History of blood clots     left leg after fracture 20+ years ago  . Genital warts     Hx of Genital warts    He has past surgical history that includes Wisdom tooth extraction; Colonoscopy; and Polypectomy.   His family history includes Breast cancer in his maternal aunt and paternal aunt; Colon cancer in his father and maternal uncle; Esophageal cancer in his father; Lung cancer in his paternal grandfather; Stroke in his maternal grandfather. There is no history of Rectal cancer or Stomach cancer.He reports that he has quit smoking. His smoking use included Cigarettes. He has a 8 pack-year smoking history. He has never used smokeless tobacco. He reports that he drinks alcohol. He reports that he does not use illicit drugs.  Current Outpatient Prescriptions on File Prior to Visit  Medication Sig Dispense Refill  .  acetaminophen (TYLENOL) 325 MG tablet Take 650 mg by mouth every 6 (six) hours as needed.    . indomethacin (INDOCIN) 25 MG capsule Take 50 mg by mouth every 12 (twelve) hours. Reported on 10/29/2015     No current facility-administered medications on file prior to visit.     Objective:  Objective Physical Exam  Constitutional: He is oriented to person, place, and time. Vital signs are normal. He appears well-developed and well-nourished. He is sleeping.  HENT:  Head: Normocephalic and atraumatic.  Eyes: EOM are normal. Pupils are equal, round, and reactive to light.  Neck: Normal range of motion. Neck supple.  Pulmonary/Chest: Breath sounds normal. He exhibits no tenderness.  Musculoskeletal: He exhibits tenderness. He exhibits no edema.       Right hip: He exhibits decreased range of motion and tenderness. He exhibits normal strength, no bony tenderness, no swelling, no crepitus, no deformity and no laceration.  Neurological: He is alert and oriented to person, place, and time.  Skin: Skin is warm and dry.  Psychiatric: He has a normal mood and affect. His behavior is normal. Judgment and thought content normal.  Nursing note and vitals reviewed.  BP 114/82 mmHg  Pulse 78  Temp(Src) 98.7 F (37.1 C) (Oral)  Ht 5\' 11"  (1.803 m)  Wt 250 lb 3.2 oz (113.49 kg)  BMI 34.91 kg/m2  SpO2 98% Wt Readings from Last 3 Encounters:  10/29/15 250 lb 3.2 oz (113.49 kg)  08/07/13 233 lb (105.688 kg)  09/15/12 250 lb (113.399 kg)  Lab Results  Component Value Date   WBC 6.2 08/08/2013   HGB 15.3 08/08/2013   HCT 44.5 08/08/2013   PLT 213.0 08/08/2013   GLUCOSE 96 08/08/2013   CHOL 175 08/08/2013   TRIG 154.0* 08/08/2013   HDL 34.80* 08/08/2013   LDLCALC 109* 08/08/2013   ALT 27 08/08/2013   AST 17 08/08/2013   NA 138 08/08/2013   K 3.9 08/08/2013   CL 102 08/08/2013   CREATININE 1.1 08/08/2013   BUN 12 08/08/2013   CO2 29 08/08/2013   TSH 2.03 08/08/2013   PSA 1.13  08/08/2013    Dg Ribs Unilateral W/chest Left  11/02/2013  CLINICAL DATA:  CHEST PAIN status post basketball injury EXAM: LEFT RIBS AND CHEST - 3+ VIEW COMPARISON:  None. FINDINGS: No fracture or other bone lesions are seen involving the ribs. There is no evidence of pneumothorax or pleural effusion. Both lungs are clear. Heart size and mediastinal contours are within normal limits. IMPRESSION: Negative. Electronically Signed   By: Kathreen Devoid   On: 11/02/2013 11:14     Assessment & Plan:  Plan I have discontinued Mr. Klahn HYDROcodone-acetaminophen. I am also having him start on meloxicam. Additionally, I am having him maintain his indomethacin and acetaminophen.  Meds ordered this encounter  Medications  . meloxicam (MOBIC) 15 MG tablet    Sig: Take 1 tablet (15 mg total) by mouth daily.    Dispense:  30 tablet    Refill:  5    Problem List Items Addressed This Visit    None    Visit Diagnoses    Hip pain, chronic, right    -  Primary    Relevant Medications    meloxicam (MOBIC) 15 MG tablet    Other Relevant Orders    Ambulatory referral to Orthopedic Surgery       Follow-up: Return if symptoms worsen or fail to improve.  Garnet Koyanagi, DO

## 2015-10-29 NOTE — Patient Instructions (Signed)
Generic Hip Exercises RANGE OF MOTION (ROM) AND STRETCHING EXERCISES  These exercises may help you when beginning to rehabilitate your injury. Doing them too aggressively can worsen your condition. Complete them slowly and gently. Your symptoms may resolve with or without further involvement from your physician, physical therapist or athletic trainer. While completing these exercises, remember:   Restoring tissue flexibility helps normal motion to return to the joints. This allows healthier, less painful movement and activity.  An effective stretch should be held for at least 30 seconds.  A stretch should never be painful. You should only feel a gentle lengthening or release in the stretched tissue. If these stretches worsen your symptoms even when done gently, consult your physician, physical therapist or athletic trainer. STRETCH - Hamstrings, Supine   Lie on your back. Loop a belt or towel over the ball of your right / left foot.  Straighten your right / left knee and slowly pull on the belt to raise your leg. Do not allow the right / left knee to bend. Keep your opposite leg flat on the floor.  Raise the leg until you feel a gentle stretch behind your right / left knee or thigh. Hold this position for __________ seconds. Repeat __________ times. Complete this stretch __________ times per day.  STRETCH - Hip Rotators   Lie on your back on a firm surface. Grasp your right / left knee with your right / left hand and your ankle with your opposite hand.  Keeping your hips and shoulders firmly planted, gently pull your right / left knee and rotate your lower leg toward your opposite shoulder until you feel a stretch in your buttocks.  Hold this stretch for __________ seconds. Repeat this stretch __________ times. Complete this stretch __________ times per day. STRETCH - Hamstrings/Adductors, V-Sit   Sit on the floor with your legs extended in a large "V," keeping your knees straight.  With  your head and chest upright, bend at your waist reaching for your right foot to stretch your left adductors.  You should feel a stretch in your left inner thigh. Hold for __________ seconds.  Return to the upright position to relax your leg muscles.  Continuing to keep your chest upright, bend straight forward at your waist to stretch your hamstrings.  You should feel a stretch behind both of your thighs and/or knees. Hold for __________ seconds.  Return to the upright position to relax your leg muscles.  Repeat steps 2 through 4 for opposite leg. Repeat __________ times. Complete this exercise __________ times per day.  STRETCHING - Hip Flexors, Lunge  Half kneel with your right / left knee on the floor and your opposite knee bent and directly over your ankle.  Keep good posture with your head over your shoulders. Tighten your buttocks to point your tailbone downward; this will prevent your back from arching too much.  You should feel a gentle stretch in the front of your thigh and/or hip. If you do not feel any resistance, slightly slide your opposite foot forward and then slowly lunge forward so your knee once again lines up over your ankle. Be sure your tailbone remains pointed downward.  Hold this stretch for __________ seconds. Repeat __________ times. Complete this stretch __________ times per day. STRENGTHENING EXERCISES These exercises may help you when beginning to rehabilitate your injury. They may resolve your symptoms with or without further involvement from your physician, physical therapist or athletic trainer. While completing these exercises, remember:     Muscles can gain both the endurance and the strength needed for everyday activities through controlled exercises.  Complete these exercises as instructed by your physician, physical therapist or athletic trainer. Progress the resistance and repetitions only as guided.  You may experience muscle soreness or fatigue, but  the pain or discomfort you are trying to eliminate should never worsen during these exercises. If this pain does worsen, stop and make certain you are following the directions exactly. If the pain is still present after adjustments, discontinue the exercise until you can discuss the trouble with your clinician. STRENGTH - Hip Extensors, Bridge   Lie on your back on a firm surface. Bend your knees and place your feet flat on the floor.  Tighten your buttocks muscles and lift your bottom off the floor until your trunk is level with your thighs. You should feel the muscles in your buttocks and back of your thighs working. If you do not feel these muscles, slide your feet 1-2 inches further away from your buttocks.  Hold this position for __________ seconds.  Slowly lower your hips to the starting position and allow your buttock muscles relax completely before beginning the next repetition.  If this exercise is too easy, you may cross your arms over your chest. Repeat __________ times. Complete this exercise __________ times per day.  STRENGTH - Hip Abductors, Straight Leg Raises  Be aware of your form throughout the entire exercise so that you exercise the correct muscles. Sloppy form means that you are not strengthening the correct muscles.  Lie on your side so that your head, shoulders, knee and hip line up. You may bend your lower knee to help maintain your balance. Your right / left leg should be on top.  Roll your hips slightly forward, so that your hips are stacked directly over each other and your right / left knee is facing forward.  Lift your top leg up 4-6 inches, leading with your heel. Be sure that your foot does not drift forward or that your knee does not roll toward the ceiling.  Hold this position for __________ seconds. You should feel the muscles in your outer hip lifting (you may not notice this until your leg begins to tire).  Slowly lower your leg to the starting position.  Allow the muscles to fully relax before beginning the next repetition. Repeat __________ times. Complete this exercise __________ times per day.  STRENGTH - Hip Adductors, Straight Leg Raises   Lie on your side so that your head, shoulders, knee and hip line up. You may place your upper foot in front to help maintain your balance. Your right / left leg should be on the bottom.  Roll your hips slightly forward, so that your hips are stacked directly over each other and your right / left knee is facing forward.  Tense the muscles in your inner thigh and lift your bottom leg 4-6 inches. Hold this position for __________ seconds.  Slowly lower your leg to the starting position. Allow the muscles to fully relax before beginning the next repetition. Repeat __________ times. Complete this exercise __________ times per day.  STRENGTH - Quadriceps, Straight Leg Raises  Quality counts! Watch for signs that the quadriceps muscle is working to insure you are strengthening the correct muscles and not "cheating" by substituting with healthier muscles.  Lay on your back with your right / left leg extended and your opposite knee bent.  Tense the muscles in the front of your right /   left thigh. You should see either your knee cap slide up or increased dimpling just above the knee. Your thigh may even quiver.  Tighten these muscles even more and raise your leg 4 to 6 inches off the floor. Hold for right / left seconds.  Keeping these muscles tense, lower your leg.  Relax the muscles slowly and completely in between each repetition. Repeat __________ times. Complete this exercise __________ times per day.  STRENGTH - Hip Abductors, Standing  Tie one end of a rubber exercise band/tubing to a secure surface (table, pole) and tie a loop at the other end.  Place the loop around your right / left ankle. Keeping your ankle with the band directly opposite of the secured end, step away until there is tension in  the tube/band.  Hold onto a chair as needed for balance.  Keeping your back upright, your shoulders over your hips, and your toes pointing forward, lift your right / left leg out to your side. Be sure to lift your leg with your hip muscles. Do not "throw" your leg or tip your body to lift your leg.  Slowly and with control, return to the starting position. Repeat exercise __________ times. Complete this exercise __________ times per day.  STRENGTH - Quadriceps, Squats  Stand in a door frame so that your feet and knees are in line with the frame.  Use your hands for balance, not support, on the frame.  Slowly lower your weight, bending at the hips and knees. Keep your lower legs upright so that they are parallel with the door frame. Squat only within the range that does not increase your knee pain. Never let your hips drop below your knees.  Slowly return upright, pushing with your legs, not pulling with your hands.   This information is not intended to replace advice given to you by your health care provider. Make sure you discuss any questions you have with your health care provider.   Document Released: 10/09/2005 Document Revised: 10/12/2014 Document Reviewed: 01/03/2009 [employer] Interactive Patient Education 2016 [employer]  

## 2015-10-29 NOTE — Progress Notes (Signed)
Pre visit review using our clinic review tool, if applicable. No additional management support is needed unless otherwise documented below in the visit note. 

## 2015-10-30 ENCOUNTER — Encounter: Payer: Self-pay | Admitting: Family Medicine

## 2016-02-28 ENCOUNTER — Telehealth: Payer: Self-pay | Admitting: Behavioral Health

## 2016-02-28 ENCOUNTER — Encounter: Payer: Self-pay | Admitting: Behavioral Health

## 2016-02-28 NOTE — Telephone Encounter (Signed)
Pre-Visit Call completed with patient and chart updated.   Pre-Visit Info documented in Specialty Comments under [employer].    

## 2016-03-03 ENCOUNTER — Encounter: Payer: Self-pay | Admitting: Family Medicine

## 2016-03-03 ENCOUNTER — Ambulatory Visit (INDEPENDENT_AMBULATORY_CARE_PROVIDER_SITE_OTHER): Payer: PRIVATE HEALTH INSURANCE | Admitting: Family Medicine

## 2016-03-03 VITALS — BP 120/90 | HR 71 | Temp 98.1°F | Ht 71.0 in | Wt 243.2 lb

## 2016-03-03 DIAGNOSIS — Z0000 Encounter for general adult medical examination without abnormal findings: Secondary | ICD-10-CM | POA: Diagnosis not present

## 2016-03-03 DIAGNOSIS — M25551 Pain in right hip: Secondary | ICD-10-CM | POA: Diagnosis not present

## 2016-03-03 LAB — COMPREHENSIVE METABOLIC PANEL
ALT: 37 U/L (ref 0–53)
AST: 21 U/L (ref 0–37)
Albumin: 4.7 g/dL (ref 3.5–5.2)
Alkaline Phosphatase: 64 U/L (ref 39–117)
BUN: 11 mg/dL (ref 6–23)
CHLORIDE: 102 meq/L (ref 96–112)
CO2: 30 meq/L (ref 19–32)
Calcium: 9.5 mg/dL (ref 8.4–10.5)
Creatinine, Ser: 0.98 mg/dL (ref 0.40–1.50)
GFR: 86.04 mL/min (ref >60.00)
GLUCOSE: 109 mg/dL — AB (ref 70–99)
Potassium: 4.1 mEq/L (ref 3.5–5.1)
SODIUM: 137 meq/L (ref 135–145)
Total Bilirubin: 1 mg/dL (ref 0.2–1.2)
Total Protein: 6.9 g/dL (ref 6.0–8.3)

## 2016-03-03 LAB — CBC WITH DIFFERENTIAL/PLATELET
BASOS PCT: 0.3 % (ref 0.0–3.0)
Basophils Absolute: 0 10*3/uL (ref 0.0–0.1)
Eosinophils Absolute: 0 10*3/uL (ref 0.0–0.7)
Eosinophils Relative: 0.6 % (ref 0.0–5.0)
HCT: 47.8 % (ref 39.0–52.0)
Hemoglobin: 16.3 g/dL (ref 13.0–17.0)
LYMPHS ABS: 2.1 10*3/uL (ref 0.7–4.0)
Lymphocytes Relative: 26.7 % (ref 12.0–46.0)
MCHC: 34 g/dL (ref 30.0–36.0)
MCV: 85.3 fl (ref 78.0–100.0)
MONO ABS: 0.6 10*3/uL (ref 0.1–1.0)
Monocytes Relative: 8 % (ref 3.0–12.0)
NEUTROS PCT: 64.4 % (ref 43.0–77.0)
Neutro Abs: 5 10*3/uL (ref 1.4–7.7)
Platelets: 207 10*3/uL (ref 150.0–400.0)
RBC: 5.6 Mil/uL (ref 4.22–5.81)
RDW: 13 % (ref 11.5–15.5)
WBC: 7.8 10*3/uL (ref 4.0–10.5)

## 2016-03-03 LAB — POCT URINALYSIS DIPSTICK
Bilirubin, UA: NEGATIVE
Blood, UA: NEGATIVE
Glucose, UA: NEGATIVE
KETONES UA: NEGATIVE
LEUKOCYTES UA: NEGATIVE
Nitrite, UA: NEGATIVE
PH UA: 6
Protein, UA: NEGATIVE
UROBILINOGEN UA: 0.2

## 2016-03-03 LAB — LIPID PANEL
CHOLESTEROL: 194 mg/dL (ref 0–200)
HDL: 38 mg/dL — ABNORMAL LOW (ref >39.00)
LDL CALC: 130 mg/dL — AB (ref 0–99)
NONHDL: 156.03
Total CHOL/HDL Ratio: 5
Triglycerides: 131 mg/dL (ref 0.0–149.0)
VLDL: 26.2 mg/dL (ref 0.0–40.0)

## 2016-03-03 LAB — TSH: TSH: 2.06 u[IU]/mL (ref 0.35–4.50)

## 2016-03-03 LAB — PSA: PSA: 1.7 ng/mL (ref 0.10–4.00)

## 2016-03-03 MED ORDER — INDOMETHACIN 20 MG PO CAPS: 2.0000 | ORAL_CAPSULE | Freq: Two times a day (BID) | ORAL | Status: DC | PRN

## 2016-03-03 NOTE — Progress Notes (Signed)
Patient ID: Jeremy Dean., male    DOB: 29-Jul-1966  Age: 50 y.o. MRN: [id number]    Subjective:  Subjective HPI Jeremy Dean. presents for cpe.   Pt never saw ortho for his hip-- they kept tellling him to call back.    Review of Systems  Constitutional: Negative.   HENT: Negative for congestion, ear pain, hearing loss, nosebleeds, postnasal drip, rhinorrhea, sinus pressure, sneezing and tinnitus.   Eyes: Negative for photophobia, discharge, itching and visual disturbance.  Respiratory: Negative.   Cardiovascular: Negative.   Gastrointestinal: Negative for abdominal pain, constipation, blood in stool, abdominal distention and anal bleeding.  Endocrine: Negative.   Genitourinary: Negative.   Musculoskeletal:       Hip pain  Skin: Negative.   Allergic/Immunologic: Negative.   Neurological: Negative for dizziness, weakness, light-headedness, numbness and headaches.  Psychiatric/Behavioral: Negative for suicidal ideas, confusion, sleep disturbance, dysphoric mood, decreased concentration and agitation. The patient is not nervous/anxious.     History Past Medical History  Diagnosis Date  . Hx of colonic polyps   . Hypertension   . Chronic kidney disease     kidney stone  . History of blood clots     left leg after fracture 20+ years ago  . Genital warts     Hx of Genital warts    He has past surgical history that includes Wisdom tooth extraction; Colonoscopy; and Polypectomy.   His family history includes Breast cancer in his maternal aunt and paternal aunt; Colon cancer in his father and maternal uncle; Esophageal cancer in his father; Lung cancer in his paternal grandfather; Stroke in his maternal grandfather. There is no history of Rectal cancer or Stomach cancer.He reports that he has quit smoking. His smoking use included Cigarettes. He has a 8 pack-year smoking history. He has never used smokeless tobacco. He reports that he drinks alcohol. He reports that he does not  use illicit drugs.  Current Outpatient Prescriptions on File Prior to Visit  Medication Sig Dispense Refill  . acetaminophen (TYLENOL) 325 MG tablet Take 650 mg by mouth every 6 (six) hours as needed.    . Multiple Vitamin (ONE-A-DAY MENS PO) Take 1 tablet by mouth daily.     No current facility-administered medications on file prior to visit.     Objective:  Objective Physical Exam  Constitutional: He is oriented to person, place, and time. He appears well-developed and well-nourished. No distress.  HENT:  Head: Normocephalic and atraumatic.  Right Ear: External ear normal.  Left Ear: External ear normal.  Nose: Nose normal.  Mouth/Throat: Oropharynx is clear and moist. No oropharyngeal exudate.  Eyes: Conjunctivae and EOM are normal. Pupils are equal, round, and reactive to light. Right eye exhibits no discharge. Left eye exhibits no discharge.  Neck: Normal range of motion. Neck supple. No JVD present. No thyromegaly present.  Cardiovascular: Normal rate, regular rhythm and intact distal pulses.  Exam reveals no gallop and no friction rub.   No murmur heard. Pulmonary/Chest: Effort normal and breath sounds normal. No respiratory distress. He has no wheezes. He has no rales. He exhibits no tenderness.  Abdominal: Soft. Bowel sounds are normal. He exhibits no distension and no mass. There is no tenderness. There is no rebound and no guarding.  Genitourinary: Rectum normal, prostate normal and penis normal. Guaiac negative stool.  Musculoskeletal: Normal range of motion. He exhibits no edema or tenderness.  Lymphadenopathy:    He has no cervical adenopathy.  Neurological: He is  alert and oriented to person, place, and time. He displays normal reflexes. He exhibits normal muscle tone.  Skin: Skin is warm and dry. No rash noted. He is not diaphoretic. No erythema. No pallor.  Psychiatric: He has a normal mood and affect. His behavior is normal. Judgment and thought content normal.    Nursing note and vitals reviewed.  BP 120/90 mmHg  Pulse 71  Temp(Src) 98.1 F (36.7 C) (Oral)  Ht 5\' 11"  (1.803 m)  Wt 243 lb 3.2 oz (110.315 kg)  BMI 33.93 kg/m2  SpO2 98% Wt Readings from Last 3 Encounters:  03/03/16 243 lb 3.2 oz (110.315 kg)  10/29/15 250 lb 3.2 oz (113.49 kg)  08/07/13 233 lb (105.688 kg)     Lab Results  Component Value Date   WBC 7.8 03/03/2016   HGB 16.3 03/03/2016   HCT 47.8 03/03/2016   PLT 207.0 03/03/2016   GLUCOSE 109* 03/03/2016   CHOL 194 03/03/2016   TRIG 131.0 03/03/2016   HDL 38.00* 03/03/2016   LDLCALC 130* 03/03/2016   ALT 37 03/03/2016   AST 21 03/03/2016   NA 137 03/03/2016   K 4.1 03/03/2016   CL 102 03/03/2016   CREATININE 0.98 03/03/2016   BUN 11 03/03/2016   CO2 30 03/03/2016   TSH 2.06 03/03/2016   PSA 1.70 03/03/2016    Dg Ribs Unilateral W/chest Left  11/02/2013  CLINICAL DATA:  CHEST PAIN status post basketball injury EXAM: LEFT RIBS AND CHEST - 3+ VIEW COMPARISON:  None. FINDINGS: No fracture or other bone lesions are seen involving the ribs. There is no evidence of pneumothorax or pleural effusion. Both lungs are clear. Heart size and mediastinal contours are within normal limits. IMPRESSION: Negative. Electronically Signed   By: Kathreen Devoid   On: 11/02/2013 11:14     Assessment & Plan:  Plan I have discontinued Mr. Barrozo meloxicam. I am also having him start on Indomethacin. Additionally, I am having him maintain his acetaminophen and Multiple Vitamin (ONE-A-DAY MENS PO).  Meds ordered this encounter  Medications  . Indomethacin 20 MG CAPS    Sig: Take 2 capsules by mouth every 12 (twelve) hours as needed.    Dispense:  60 capsule    Refill:  0    Problem List Items Addressed This Visit    Hip pain   Relevant Medications   Indomethacin 20 MG CAPS   Other Relevant Orders   Ambulatory referral to Orthopedic Surgery   Preventative health care - Primary    ghm utd Check labs See AVS      Relevant  Orders   Comprehensive metabolic panel (Completed)   CBC with Differential/Platelet (Completed)   Lipid panel (Completed)   POCT urinalysis dipstick (Completed)   PSA (Completed)   TSH (Completed)      Follow-up: Return in about 1 year (around 03/03/2017), or if symptoms worsen or fail to improve, for annual exam, fasting.  Ann Held, DO

## 2016-03-03 NOTE — Progress Notes (Signed)
Pre visit review using our clinic review tool, if applicable. No additional management support is needed unless otherwise documented below in the visit note. 

## 2016-03-03 NOTE — Assessment & Plan Note (Signed)
ghm utd Check labs See AVS 

## 2016-03-03 NOTE — Patient Instructions (Addendum)

## 2016-03-04 NOTE — Addendum Note (Signed)
Addended byDamita Dunnings D on: 03/04/2016 09:17 AM   Modules accepted: Orders

## 2016-04-28 ENCOUNTER — Other Ambulatory Visit: Payer: Self-pay | Admitting: Family Medicine

## 2016-04-28 DIAGNOSIS — M25551 Pain in right hip: Secondary | ICD-10-CM

## 2016-04-29 NOTE — Telephone Encounter (Signed)
Rx is not on the medication list. Please advise    KP

## 2016-04-30 NOTE — Telephone Encounter (Signed)
Who rx this?  Please ask pt--- I've never rx this before.

## 2016-05-04 ENCOUNTER — Other Ambulatory Visit: Payer: Self-pay | Admitting: Family Medicine

## 2016-05-04 DIAGNOSIS — M25551 Pain in right hip: Secondary | ICD-10-CM

## 2016-05-04 MED ORDER — INDOMETHACIN 20 MG PO CAPS: 2.0000 | ORAL_CAPSULE | Freq: Two times a day (BID) | ORAL | 2 refills | Status: DC | PRN

## 2016-11-18 ENCOUNTER — Ambulatory Visit (INDEPENDENT_AMBULATORY_CARE_PROVIDER_SITE_OTHER): Payer: Managed Care, Other (non HMO) | Admitting: Medical

## 2016-11-18 ENCOUNTER — Telehealth: Payer: Self-pay | Admitting: Medical

## 2016-11-18 ENCOUNTER — Encounter: Payer: Self-pay | Admitting: Medical

## 2016-11-18 ENCOUNTER — Ambulatory Visit (HOSPITAL_BASED_OUTPATIENT_CLINIC_OR_DEPARTMENT_OTHER)
Admission: RE | Admit: 2016-11-18 | Discharge: 2016-11-18 | Disposition: A | Discharge status: Home / Self Care | Payer: Managed Care, Other (non HMO) | Source: Ambulatory Visit | Attending: Medical | Admitting: Medical

## 2016-11-18 VITALS — BP 135/76 | HR 91 | Temp 98.2°F | Resp 16 | Ht 71.0 in | Wt 255.2 lb

## 2016-11-18 DIAGNOSIS — R0781 Pleurodynia: Secondary | ICD-10-CM | POA: Insufficient documentation

## 2016-11-18 DIAGNOSIS — J40 Bronchitis, not specified as acute or chronic: Secondary | ICD-10-CM | POA: Diagnosis not present

## 2016-11-18 DIAGNOSIS — R059 Cough, unspecified: Secondary | ICD-10-CM

## 2016-11-18 DIAGNOSIS — R1011 Right upper quadrant pain: Secondary | ICD-10-CM | POA: Diagnosis not present

## 2016-11-18 DIAGNOSIS — R05 Cough: Secondary | ICD-10-CM | POA: Insufficient documentation

## 2016-11-18 DIAGNOSIS — R739 Hyperglycemia, unspecified: Secondary | ICD-10-CM

## 2016-11-18 LAB — CBC WITH DIFFERENTIAL/PLATELET
Basophils Absolute: 0 10*3/uL (ref 0.0–0.1)
Basophils Relative: 0.3 % (ref 0.0–3.0)
EOS ABS: 0 10*3/uL (ref 0.0–0.7)
Eosinophils Relative: 0.4 % (ref 0.0–5.0)
HEMATOCRIT: 43.9 % (ref 39.0–52.0)
Hemoglobin: 15.5 g/dL (ref 13.0–17.0)
LYMPHS PCT: 19.8 % (ref 12.0–46.0)
Lymphs Abs: 1.8 10*3/uL (ref 0.7–4.0)
MCHC: 35.4 g/dL (ref 30.0–36.0)
MCV: 84 fl (ref 78.0–100.0)
MONOS PCT: 5 % (ref 3.0–12.0)
Monocytes Absolute: 0.4 10*3/uL (ref 0.1–1.0)
NEUTROS ABS: 6.7 10*3/uL (ref 1.4–7.7)
Neutrophils Relative %: 74.5 % (ref 43.0–77.0)
PLATELETS: 281 10*3/uL (ref 150.0–400.0)
RBC: 5.22 Mil/uL (ref 4.22–5.81)
RDW: 13 % (ref 11.5–15.5)
WBC: 9 10*3/uL (ref 4.0–10.5)

## 2016-11-18 LAB — COMPREHENSIVE METABOLIC PANEL
ALBUMIN: 4.3 g/dL (ref 3.5–5.2)
ALT: 44 U/L (ref 0–53)
AST: 23 U/L (ref 0–37)
Alkaline Phosphatase: 68 U/L (ref 39–117)
BILIRUBIN TOTAL: 0.8 mg/dL (ref 0.2–1.2)
BUN: 14 mg/dL (ref 6–23)
CO2: 30 mEq/L (ref 19–32)
CREATININE: 1.17 mg/dL (ref 0.40–1.50)
Calcium: 9.5 mg/dL (ref 8.4–10.5)
Chloride: 103 mEq/L (ref 96–112)
GFR: 69.93 mL/min (ref >60.00)
Glucose, Bld: 186 mg/dL — ABNORMAL HIGH (ref 70–99)
Potassium: 3.7 mEq/L (ref 3.5–5.1)
Sodium: 138 mEq/L (ref 135–145)
Total Protein: 7 g/dL (ref 6.0–8.3)

## 2016-11-18 LAB — POC URINALSYSI DIPSTICK (AUTOMATED)
Bilirubin, UA: NEGATIVE
Glucose, UA: NEGATIVE
Ketones, UA: NEGATIVE
Leukocytes, UA: NEGATIVE
NITRITE UA: NEGATIVE
PROTEIN UA: NEGATIVE
RBC UA: NEGATIVE
UROBILINOGEN UA: 0.2
pH, UA: 6

## 2016-11-18 MED ORDER — BENZONATATE 100 MG PO CAPS: 100.0000 mg | ORAL_CAPSULE | Freq: Three times a day (TID) | ORAL | 0 refills | Status: DC | PRN

## 2016-11-18 MED ORDER — AZITHROMYCIN 250 MG PO TABS: ORAL_TABLET | ORAL | 0 refills | Status: DC

## 2016-11-18 NOTE — Progress Notes (Signed)
Pre visit review using our clinic review tool, if applicable. No additional management support is needed unless otherwise documented below in the visit note/SLS  

## 2016-11-18 NOTE — Patient Instructions (Addendum)
Post flu you may have bronchitis vs walking pneumonia. Rx azithromycin.  For cough benzonatate.  Use iburofen 600 mg every 8 hours.(this would help if muscle pain)  Will get cxr and rib view today.  Cbc and cmp today.  If pain rt lower rib area persists/ rt upper abd persists then on Friday get Abd ultrasound.  Follow up 5-7 days or as needed

## 2016-11-18 NOTE — Progress Notes (Signed)
Subjective:    Patient ID: Jeremy Dean., male    DOB: May 29, 1966, 51 y.o.   MRN: [id number]  HPI  Pt states about 10 days ago diagnosed with the flu(pt was given tamiflu but not tested) . He states on Saturday he noticed some  Rt sided abdomen pain. He points to rt lower rib and upper quadrant area. He notices pain more late afternoon. Pt thinks maybe caused by cough per pt. He also thinks maybe has some bronchitis.  Pt had dry cough presently. No fever, no chills recently. No sweats.   Pt has some constant feeling of full and bloated. Pt last bm was normal. Little on loose side but not diarrhea.   Pt points to his ruq/flank area. No rash.     Review of Systems  HENT: Positive for congestion. Negative for ear pain, sinus pain and sinus pressure.   Respiratory: Positive for cough. Negative for chest tightness, shortness of breath and wheezing.   Cardiovascular: Negative for chest pain and palpitations.  Gastrointestinal: Positive for abdominal pain. Negative for abdominal distention, anal bleeding, blood in stool, diarrhea, nausea and vomiting.  Genitourinary: Negative for dysuria and enuresis.  Musculoskeletal: Negative for back pain.       Rt lower rib area pain.  Skin: Negative for rash.  Neurological: Negative for dizziness, seizures, speech difficulty, weakness and light-headedness.  Hematological: Negative for adenopathy. Does not bruise/bleed easily.  Psychiatric/Behavioral: Negative for behavioral problems and confusion.   Past Medical History:  Diagnosis Date  . Chronic kidney disease    kidney stone  . Genital warts    Hx of Genital warts  . History of blood clots    left leg after fracture 20+ years ago  . Hx of colonic polyps   . Hypertension      Social History   Social History  . Marital status: Married    Spouse name: N/A  . Number of children: N/A  . Years of education: N/A   Occupational History  . Not on file.   Social History Main Topics    . Smoking status: Former Smoker    Packs/day: 2.00    Years: 4.00    Types: Cigarettes  . Smokeless tobacco: Never Used  . Alcohol use Yes     Comment: socially  . Drug use: No  . Sexual activity: Yes   Other Topics Concern  . Not on file   Social History Narrative  . No narrative on file    Past Surgical History:  Procedure Laterality Date  . COLONOSCOPY    . POLYPECTOMY    . WISDOM TOOTH EXTRACTION      Family History  Problem Relation Age of Onset  . Colon cancer Father   . Esophageal cancer Father   . Breast cancer Maternal Aunt   . Colon cancer Maternal Uncle   . Rectal cancer Neg Hx   . Stomach cancer Neg Hx   . Lung cancer Paternal Grandfather   . Breast cancer Paternal Aunt   . Stroke Maternal Grandfather     No Known Allergies  Current Outpatient Prescriptions on File Prior to Visit  Medication Sig Dispense Refill  . acetaminophen (TYLENOL) 325 MG tablet Take 650 mg by mouth every 6 (six) hours as needed.    . Multiple Vitamin (ONE-A-DAY MENS PO) Take 1 tablet by mouth daily.     No current facility-administered medications on file prior to visit.     BP 135/76 (BP Location:  Right Arm, Patient Position: Sitting, Cuff Size: Large)   Pulse 91   Temp 98.2 F (36.8 C) (Oral)   Resp 16   Ht 5\' 11"  (1.803 m)   Wt 255 lb 4 oz (115.8 kg)   SpO2 99%   BMI 35.60 kg/m       Objective:   Physical Exam   General  Mental Status - Alert. General Appearance - Well groomed. Not in acute distress.  Skin Rashes- No Rashes.  HEENT Head- Normal. Ear Auditory Canal - Left- Normal. Right - Normal.Tympanic Membrane- Left- Normal. Right- Normal. Eye Sclera/Conjunctiva- Left- Normal. Right- Normal. Nose & Sinuses Nasal Mucosa- Left-  Boggy and Congested. Right-  Boggy and  Congested.Bilateral no  maxillary and  No frontal sinus pressure. Mouth & Throat Lips: Upper Lip- Normal: no dryness, cracking, pallor, cyanosis, or vesicular eruption. Lower  Lip-Normal: no dryness, cracking, pallor, cyanosis or vesicular eruption. Buccal Mucosa- Bilateral- No Aphthous ulcers. Oropharynx- No Discharge or Erythema. Tonsils: Characteristics- Bilateral- No Erythema or Congestion. Size/Enlargement- Bilateral- No enlargement. Discharge- bilateral-None.  Neck Neck- Supple. No Masses.   Chest and Lung Exam Auscultation: Breath Sounds:-Clear even and unlabored.  Cardiovascular Auscultation:Rythm- Regular, rate and rhythm. Murmurs & Other Heart Sounds:Ausculatation of the heart reveal- No Murmurs.  Lymphatic Head & Neck General Head & Neck Lymphatics: Bilateral: Description- No Localized lymphadenopathy.  Abdomen- soft, faint rt upper quadrant tender, nd, +bs, no rebound or a guarding.  Anterior thorax- faint lower rib tenderness to palpation.  Back- no cva tenderness.  Skin- no warmth, no induration, no redness. And no vesicles.        Assessment & Plan:  Post flu you may have bronchitis vs walking pneumonia. Rx azithromycin.  For cough benzonatate.  Use iburofen 600 mg every 8 hours.(this would help if muscle pain)  Will get cxr and rib view today.  Cbc and cmp today.  If pain rt lower rib area persists/ rt upper abd persists then on Friday get Abd ultrasound.  Follow up 5-7 days or as needed  Elianna Windom, Percell Miller, [employer]

## 2016-11-18 NOTE — Telephone Encounter (Signed)
Added a1c order.

## 2016-11-23 ENCOUNTER — Telehealth: Payer: Self-pay | Admitting: *Deleted

## 2016-11-23 DIAGNOSIS — R1011 Right upper quadrant pain: Secondary | ICD-10-CM

## 2016-11-23 DIAGNOSIS — R739 Hyperglycemia, unspecified: Secondary | ICD-10-CM

## 2016-11-23 NOTE — Telephone Encounter (Signed)
Notified pt and he voices understanding. He continues to have abdominal pain. Order entered and u/s scheduled for tomorrow at 11:30am. Pt aware to be fasting for u/s. Pt will come to lab first to complete testing for hgb a1c. Lab appt scheduled and order entered.

## 2016-11-23 NOTE — Telephone Encounter (Signed)
-----   Message from Mackie Pai, PA-C sent at 11/18/2016  6:05 PM EST ----- Urine showed no blood. Labs showed no infection fighting cells  elevated. Liver enzymes are normal. Take ibuprofen as discussed. If pain rt lower rib/rt upper abdomen persists  then will get [place] of rt upper quadrant abdomen . But want to know  by Friday am if ibuprofen helping pain. So can order [place] if needed. Sugar is elevated at 186. Sometimes with illness sugar can go up. Low sugar diet  Recommended and will ask lab to get 3 month blood sugar average.

## 2016-11-24 ENCOUNTER — Ambulatory Visit (HOSPITAL_BASED_OUTPATIENT_CLINIC_OR_DEPARTMENT_OTHER)
Admission: RE | Admit: 2016-11-24 | Discharge: 2016-11-24 | Disposition: A | Discharge status: Home / Self Care | Payer: Managed Care, Other (non HMO) | Source: Ambulatory Visit | Attending: Medical | Admitting: Medical

## 2016-11-24 ENCOUNTER — Other Ambulatory Visit (INDEPENDENT_AMBULATORY_CARE_PROVIDER_SITE_OTHER): Payer: Managed Care, Other (non HMO)

## 2016-11-24 DIAGNOSIS — R1011 Right upper quadrant pain: Secondary | ICD-10-CM | POA: Insufficient documentation

## 2016-11-24 DIAGNOSIS — R739 Hyperglycemia, unspecified: Secondary | ICD-10-CM

## 2016-11-24 LAB — HEMOGLOBIN A1C: Hgb A1c MFr Bld: 6 % (ref 4.6–6.5)

## 2016-11-24 NOTE — Telephone Encounter (Signed)
Done. Lab drawn

## 2017-03-05 ENCOUNTER — Encounter: Payer: PRIVATE HEALTH INSURANCE | Admitting: Family Medicine

## 2017-05-31 ENCOUNTER — Other Ambulatory Visit: Payer: Self-pay | Admitting: Family Medicine

## 2017-05-31 DIAGNOSIS — M25551 Pain in right hip: Secondary | ICD-10-CM

## 2017-05-31 MED ORDER — INDOMETHACIN 20 MG PO CAPS: 2.0000 | ORAL_CAPSULE | Freq: Two times a day (BID) | ORAL | 0 refills | Status: DC | PRN

## 2017-05-31 NOTE — Telephone Encounter (Signed)
Rx sent 

## 2017-05-31 NOTE — Telephone Encounter (Signed)
Self.  Requesting a refill for INDOCIN. Pt says that he hasn't had to take medication in a while but needs it now because he is having some hip pain.    [facility] [address], [place], [place]

## 2017-06-01 ENCOUNTER — Other Ambulatory Visit: Payer: Self-pay

## 2017-06-01 ENCOUNTER — Encounter: Payer: Managed Care, Other (non HMO) | Admitting: Family Medicine

## 2017-06-01 MED ORDER — INDOMETHACIN 25 MG PO CAPS: 25.0000 mg | ORAL_CAPSULE | Freq: Two times a day (BID) | ORAL | 0 refills | Status: DC | PRN

## 2017-10-07 ENCOUNTER — Encounter: Payer: Self-pay | Admitting: Gastroenterology

## 2018-01-31 IMAGING — US US ABDOMEN LIMITED
1 series · 14 of 25 positions shown · non-contrast
Comparison: None.

CLINICAL DATA: Right upper quadrant pain for 1-2 weeks.

EXAM:
US ABDOMEN LIMITED - RIGHT UPPER QUADRANT

[Series 1: us abdomen limited · 0.24mm/px · 14 of 42 slices shown]
[im 1/42]
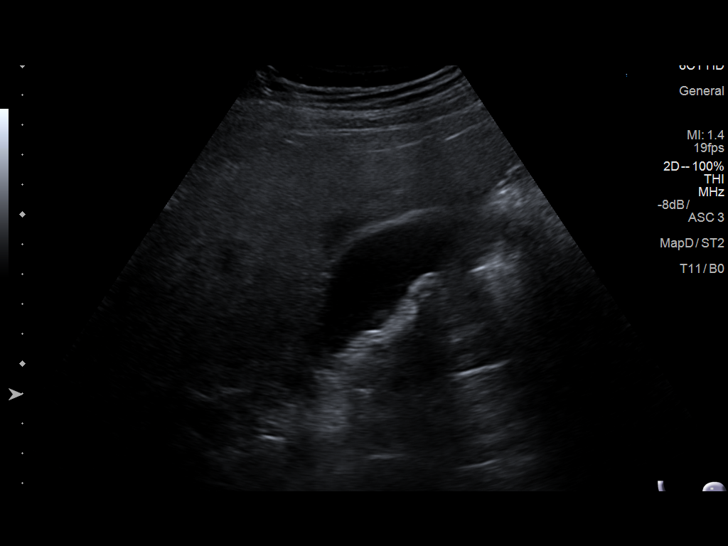
[im 4/42]
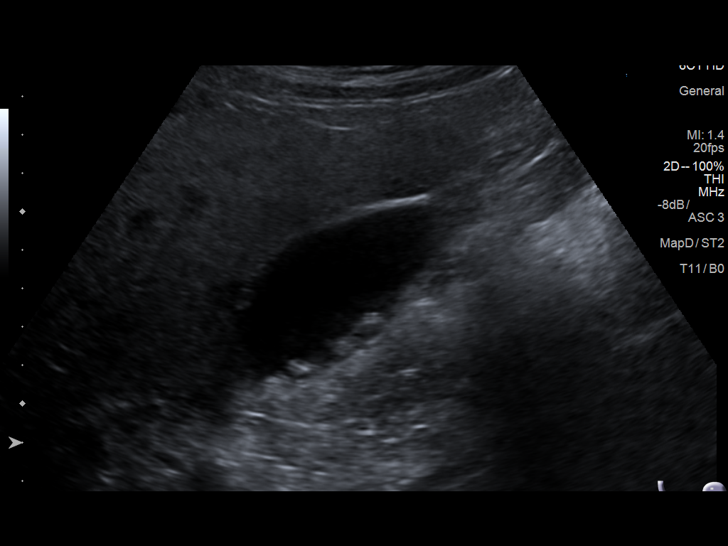
[im 7/42]
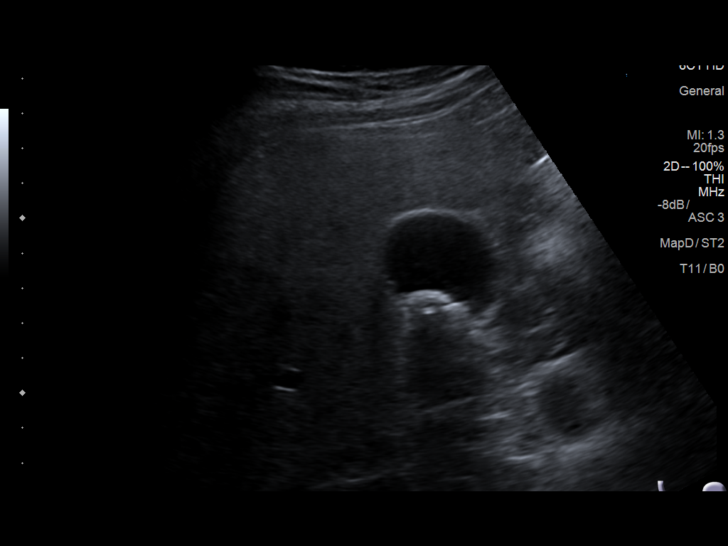
[im 11/42]
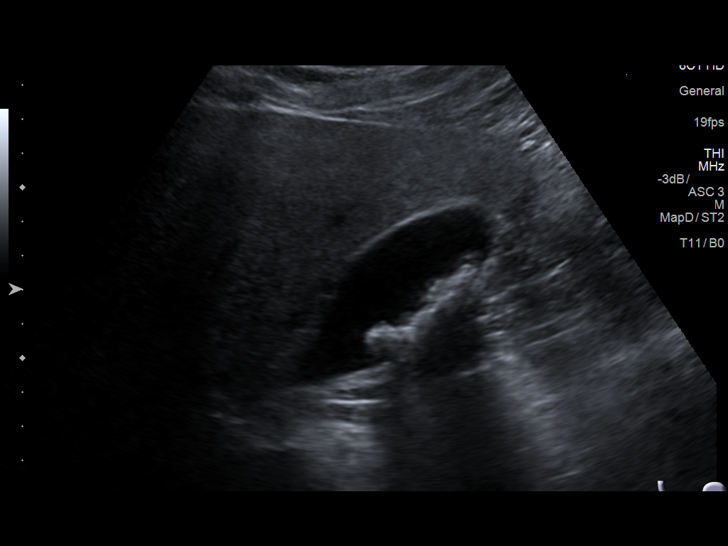
[im 14/42]
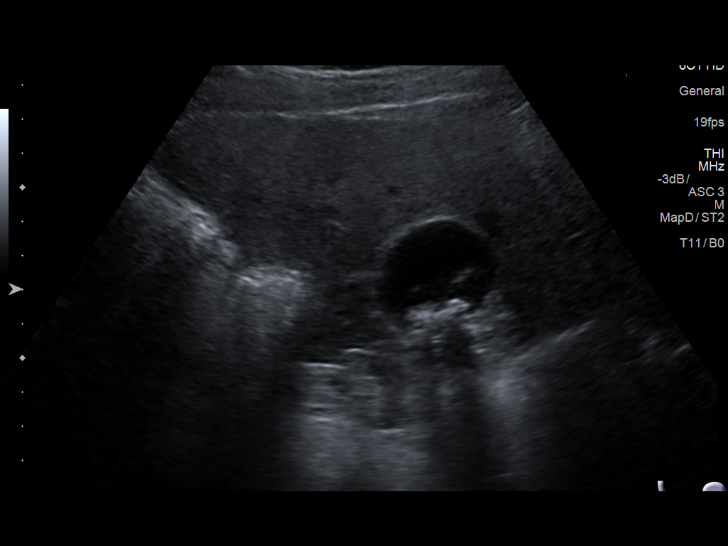
[im 16/42]
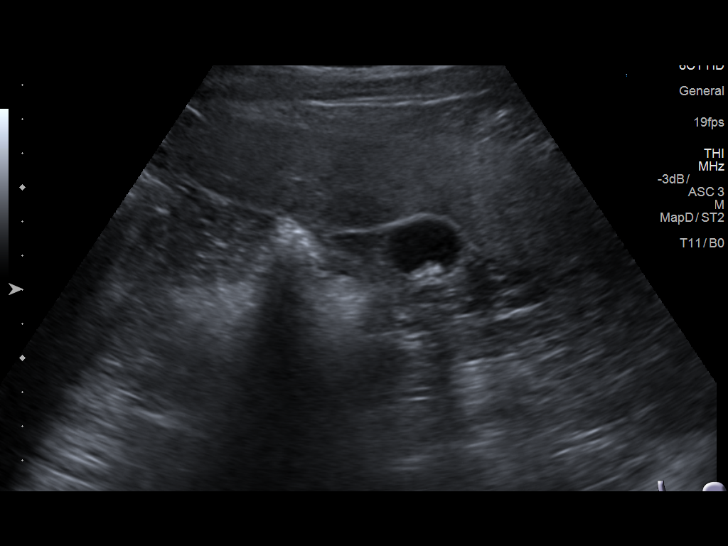
[im 19/42]
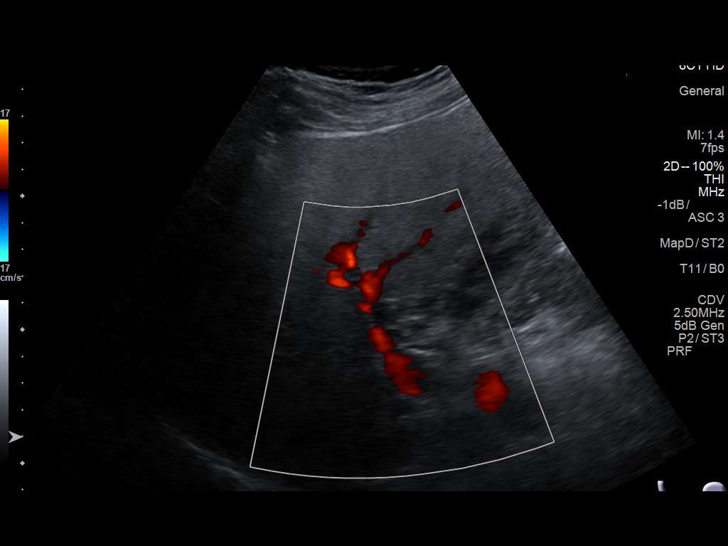
[im 23/42]
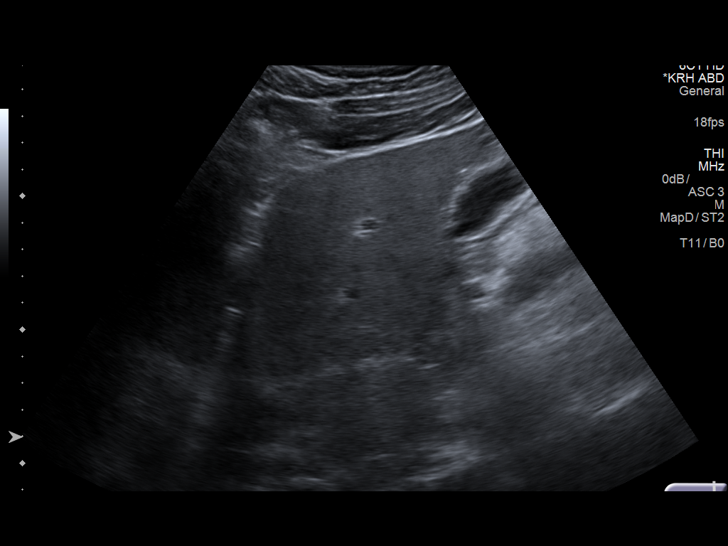
[im 26/42]
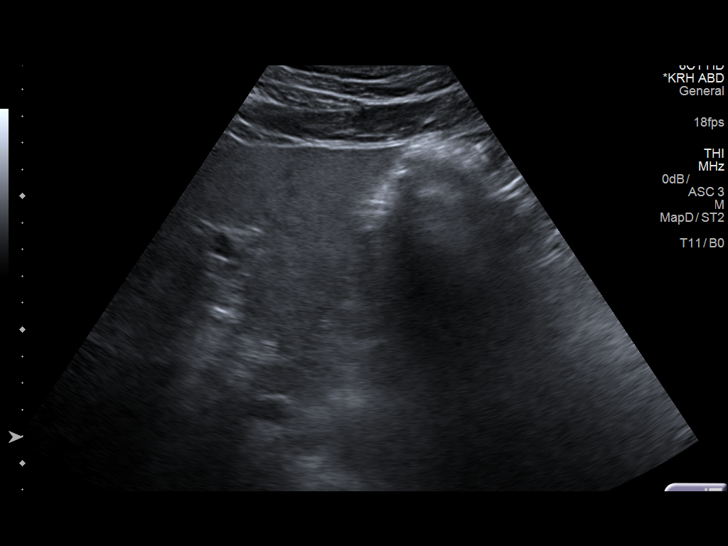
[im 28/42]
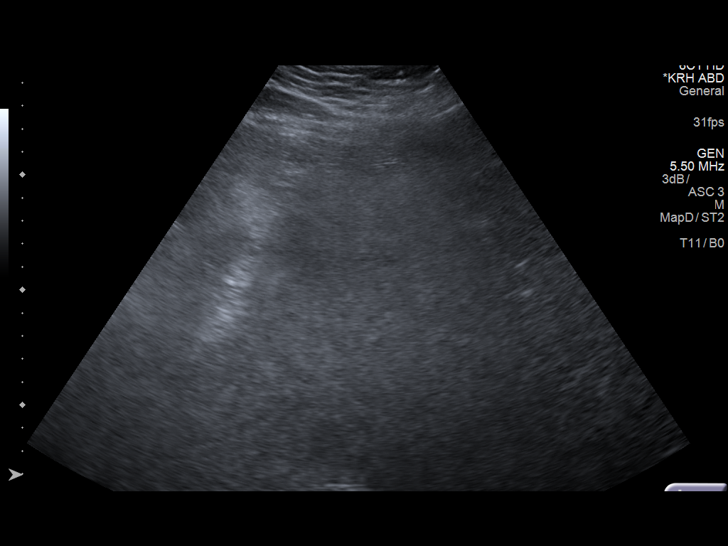
[im 31/42]
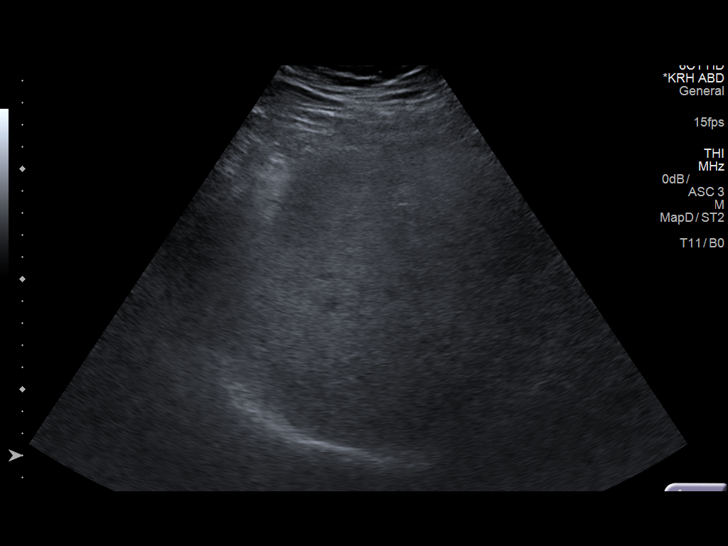
[im 35/42]
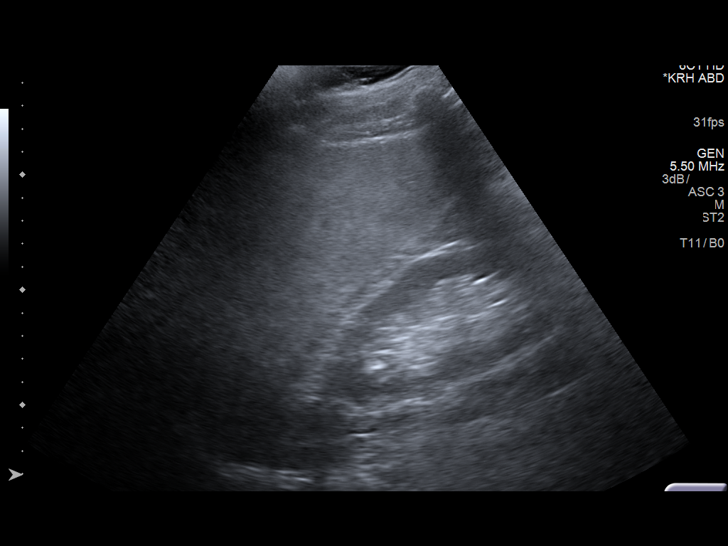
[im 38/42]
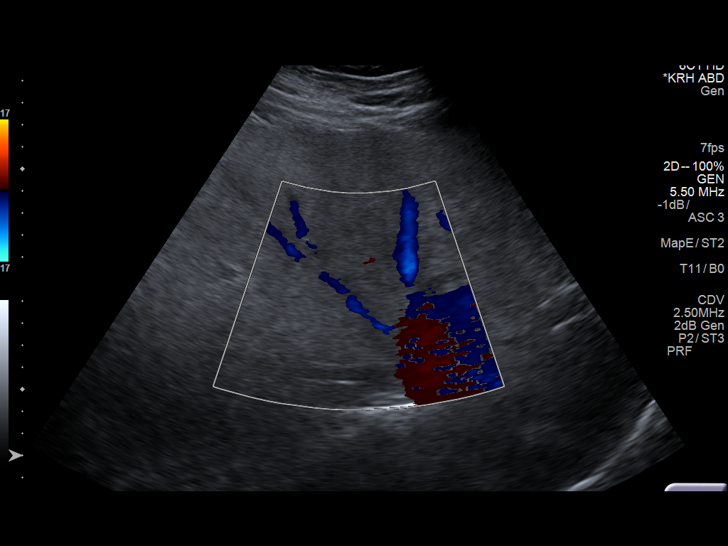
[im 42/42]
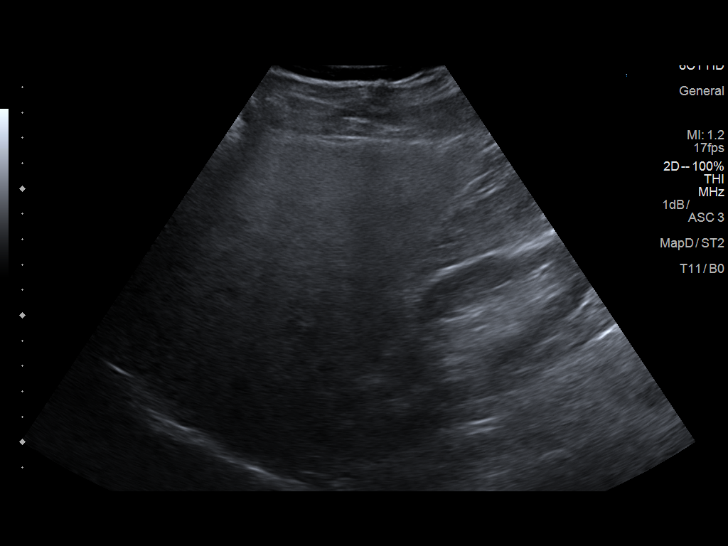

[14 of 25 positions shown; findings below may reference images not displayed]

FINDINGS: Gallbladder:

No gallstones or gallbladder wall thickening. No pericholecystic
fluid. The sonographer reports no sonographic Murphy's sign.

Common bile duct:

Diameter: 2-3 mm

Liver:

Increased echogenicity suggests steatosis.
IMPRESSION: Probable hepatic steatosis.  Otherwise unremarkable.

## 2018-02-07 ENCOUNTER — Other Ambulatory Visit: Payer: Self-pay | Admitting: Family Medicine

## 2018-02-07 ENCOUNTER — Encounter: Payer: Self-pay | Admitting: Gastroenterology

## 2018-02-08 MED ORDER — INDOMETHACIN 25 MG PO CAPS: 25.0000 mg | ORAL_CAPSULE | Freq: Two times a day (BID) | ORAL | 0 refills | Status: DC | PRN

## 2018-02-08 NOTE — Telephone Encounter (Signed)
30 day is ok and schedule ov--- thanks

## 2018-02-08 NOTE — Telephone Encounter (Signed)
Refill sent and message sent to pt.

## 2018-02-08 NOTE — Telephone Encounter (Signed)
Dr Carollee Herter-- pt last seen by you in 2017. Saw Edward for acute visit 11/2016. We received refill request for indomethacin.  Pt needs office visit. Do you want [place] to given a 30 day supply and call to schedule appt?

## 2018-03-08 ENCOUNTER — Telehealth: Payer: Self-pay | Admitting: Family Medicine

## 2018-03-08 NOTE — Telephone Encounter (Signed)
Called Patient and adv patient pcp needs him to make an appointment to come in and be seen. Patient stts the refill that was sent will last him a very long time and he is getting acortzone shot tomoro 03-09-2018 and will be getting a hip replacement  08-2018. Patient decline appointment. Adv patient I will informed PCP.

## 2018-03-08 NOTE — Telephone Encounter (Signed)
noted 

## 2018-03-08 NOTE — Telephone Encounter (Signed)
Denial sent to pharmacy for indomethacin. Mychart message was sent to pt on 02/08/18 informing him he is past due for follow up and would need office visit for further refills. Pt has not read message at this time. He has not been seen since 2017 and will need office visit for refills. Please call pt to schedule appt soon. Thanks!

## 2018-03-28 ENCOUNTER — Ambulatory Visit (AMBULATORY_SURGERY_CENTER): Payer: Self-pay | Admitting: *Deleted

## 2018-03-28 ENCOUNTER — Other Ambulatory Visit: Payer: Self-pay

## 2018-03-28 VITALS — Ht 71.0 in | Wt 252.4 lb

## 2018-03-28 DIAGNOSIS — Z86010 Personal history of colonic polyps: Secondary | ICD-10-CM

## 2018-03-28 DIAGNOSIS — Z800 Family history of malignant neoplasm of digestive organs: Secondary | ICD-10-CM

## 2018-03-28 MED ORDER — NA SULFATE-K SULFATE-MG SULF 17.5-3.13-1.6 GM/177ML PO SOLN
1.0000 | Freq: Once | ORAL | 0 refills | Status: AC
Start: 2018-03-28 — End: 2018-03-28

## 2018-04-04 ENCOUNTER — Encounter: Payer: Self-pay | Admitting: Gastroenterology

## 2018-04-12 ENCOUNTER — Encounter: Payer: Self-pay | Admitting: Gastroenterology

## 2018-04-12 ENCOUNTER — Ambulatory Visit (AMBULATORY_SURGERY_CENTER): Payer: Managed Care, Other (non HMO) | Admitting: Gastroenterology

## 2018-04-12 VITALS — BP 120/88 | HR 69 | Temp 98.6°F | Resp 14 | Ht 71.0 in | Wt 255.0 lb

## 2018-04-12 DIAGNOSIS — Z1211 Encounter for screening for malignant neoplasm of colon: Secondary | ICD-10-CM | POA: Diagnosis present

## 2018-04-12 DIAGNOSIS — D128 Benign neoplasm of rectum: Secondary | ICD-10-CM

## 2018-04-12 DIAGNOSIS — D124 Benign neoplasm of descending colon: Secondary | ICD-10-CM

## 2018-04-12 DIAGNOSIS — Z800 Family history of malignant neoplasm of digestive organs: Secondary | ICD-10-CM

## 2018-04-12 DIAGNOSIS — D125 Benign neoplasm of sigmoid colon: Secondary | ICD-10-CM

## 2018-04-12 DIAGNOSIS — K635 Polyp of colon: Secondary | ICD-10-CM

## 2018-04-12 DIAGNOSIS — D129 Benign neoplasm of anus and anal canal: Secondary | ICD-10-CM

## 2018-04-12 DIAGNOSIS — D122 Benign neoplasm of ascending colon: Secondary | ICD-10-CM

## 2018-04-12 MED ORDER — SODIUM CHLORIDE 0.9 % IV SOLN: 500.0000 mL | Freq: Once | INTRAVENOUS | Status: DC

## 2018-04-12 NOTE — Progress Notes (Signed)
Pt's states no medical or surgical changes since previsit or office visit. 

## 2018-04-12 NOTE — Progress Notes (Signed)
To PACU, VSS. Report to [email or web address] 

## 2018-04-12 NOTE — Progress Notes (Signed)
Called to room to assist during endoscopic procedure.  Patient ID and intended procedure confirmed with present staff. Received instructions for my participation in the procedure from the performing physician.  

## 2018-04-12 NOTE — Patient Instructions (Signed)
YOU HAD AN ENDOSCOPIC PROCEDURE TODAY AT THE [facility]:   Refer to the procedure report that was given to you for any specific questions about what was found during the examination.  If the procedure report does not answer your questions, please call your gastroenterologist to clarify.  If you requested that your care partner not be given the details of your procedure findings, then the procedure report has been included in a sealed envelope for you to review at your convenience later.  YOU SHOULD EXPECT: Some feelings of bloating in the abdomen. Passage of more gas than usual.  Walking can help get rid of the air that was put into your GI tract during the procedure and reduce the bloating. If you had a lower endoscopy (such as a colonoscopy or flexible sigmoidoscopy) you may notice spotting of blood in your stool or on the toilet paper. If you underwent a bowel prep for your procedure, you may not have a normal bowel movement for a few days.  Please Note:  You might notice some irritation and congestion in your nose or some drainage.  This is from the oxygen used during your procedure.  There is no need for concern and it should clear up in a day or so.  SYMPTOMS TO REPORT IMMEDIATELY:   Following lower endoscopy (colonoscopy or flexible sigmoidoscopy):  Excessive amounts of blood in the stool  Significant tenderness or worsening of abdominal pains  Swelling of the abdomen that is new, acute  Fever of 100F or higher  For urgent or emergent issues, a gastroenterologist can be reached at any hour by calling [phone].   DIET:  We do recommend a small meal at first, but then you may proceed to your regular diet.  Drink plenty of fluids but you should avoid alcoholic beverages for 24 hours.  MEDICATIONS: Continue present medications.  Please see handouts given to you by your recovery nurse.  ACTIVITY:  You should plan to take it easy for the rest of today and you should NOT  DRIVE or use heavy machinery until tomorrow (because of the sedation medicines used during the test).    FOLLOW UP: Our staff will call the number listed on your records the next business day following your procedure to check on you and address any questions or concerns that you may have regarding the information given to you following your procedure. If we do not reach you, we will leave a message.  However, if you are feeling well and you are not experiencing any problems, there is no need to return our call.  We will assume that you have returned to your regular daily activities without incident.  If any biopsies were taken you will be contacted by phone or by letter within the next 1-3 weeks.  Please call us at [phone] if you have not heard about the biopsies in 3 weeks.   Thank you for allowing us to provide for your healthcare needs today.   SIGNATURES/CONFIDENTIALITY: You and/or your care partner have signed paperwork which will be entered into your electronic medical record.  These signatures attest to the fact that that the information above on your After Visit Summary has been reviewed and is understood.  Full responsibility of the confidentiality of this discharge information lies with you and/or your care-partner. 

## 2018-04-12 NOTE — Op Note (Signed)
[place] Patient Name: Jeremy Dean Procedure Date: 04/12/2018 7:56 AM MRN: [id number] Endoscopist: Mallie Mussel L. Jeremy Dean , MD Age: 52 Referring MD:  Date of Birth: 1966/02/05 Gender: Male Account #: [id number] Procedure:                Colonoscopy Indications:              Colon cancer screening in patient at increased                            risk: Colorectal cancer in father,                           (TA and HP on colonoscopy 09/2012) Medicines:                Monitored Anesthesia Care Procedure:                Pre-Anesthesia Assessment:                           - Prior to the procedure, a History and Physical                            was performed, and patient medications and                            allergies were reviewed. The patient's tolerance of                            previous anesthesia was also reviewed. The risks                            and benefits of the procedure and the sedation                            options and risks were discussed with the patient.                            All questions were answered, and informed consent                            was obtained. Prior Anticoagulants: The patient has                            taken no previous anticoagulant or antiplatelet                            agents. ASA Grade Assessment: II - A patient with                            mild systemic disease. After reviewing the risks                            and benefits, the patient was deemed in  satisfactory condition to undergo the procedure.                           After obtaining informed consent, the colonoscope                            was passed under direct vision. Throughout the                            procedure, the patient's blood pressure, pulse, and                            oxygen saturations were monitored continuously. The                            Colonoscope was introduced through the anus and                             advanced to the the cecum, identified by                            appendiceal orifice and ileocecal valve. The                            colonoscopy was performed without difficulty. The                            patient tolerated the procedure well. The quality                            of the bowel preparation was excellent. The                            ileocecal valve, appendiceal orifice, and rectum                            were photographed. The quality of the bowel                            preparation was evaluated using the BBPS [facility]                            Bowel Preparation Scale) with scores of: Right                            Colon = 3, Transverse Colon = 3 and Left Colon = 3                            (entire mucosa seen well with no residual staining,                            small fragments of stool or opaque liquid). The  total BBPS score equals 9. Scope In: 8:08:54 AM Scope Out: 8:24:14 AM Scope Withdrawal Time: 0 hours 13 minutes 17 seconds  Total Procedure Duration: 0 hours 15 minutes 20 seconds  Findings:                 The perianal and digital rectal examinations were                            normal.                           A 6 mm polyp was found in the ascending colon. The                            polyp was semi-pedunculated. The polyp was removed                            with a hot snare. Resection and retrieval were                            complete.                           Three sessile polyps were found in the rectum,                            sigmoid colon and descending colon. The polyps were                            3 to 6 mm in size. These polyps were removed with a                            cold snare. Resection and retrieval were complete.                           The exam was otherwise without abnormality on                            direct and retroflexion  views. Complications:            No immediate complications. Estimated Blood Loss:     Estimated blood loss was minimal. Impression:               - One 6 mm polyp in the ascending colon, removed                            with a hot snare. Resected and retrieved.                           - Three 3 to 6 mm polyps in the rectum, in the                            sigmoid colon and in the descending colon, removed  with a cold snare. Resected and retrieved.                           - The examination was otherwise normal on direct                            and retroflexion views. Recommendation:           - Patient has a contact number available for                            emergencies. The signs and symptoms of potential                            delayed complications were discussed with the                            patient. Return to normal activities tomorrow.                            Written discharge instructions were provided to the                            patient.                           - Resume previous diet.                           - Continue present medications.                           - Await pathology results.                           - Repeat colonoscopy is recommended for                            surveillance. The colonoscopy date will be                            determined after pathology results from today's                            exam become available for review. Jeremy Dean L. Jeremy Carrow, MD 04/12/2018 8:30:06 AM This report has been signed electronically.

## 2018-04-13 ENCOUNTER — Telehealth: Payer: Self-pay | Admitting: *Deleted

## 2018-04-13 NOTE — Telephone Encounter (Signed)
  Follow up Call-  Call back number 04/12/2018  Post procedure Call Back phone  # [phone]  Permission to leave phone message Yes  Some recent data might be hidden     Patient questions:  Do you have a fever, pain , or abdominal swelling? No. Pain Score  0 *  Have you tolerated food without any problems? Yes.    Have you been able to return to your normal activities? Yes.    Do you have any questions about your discharge instructions: Diet   No. Medications  No. Follow up visit  No.  Do you have questions or concerns about your Care? No.  Actions: * If pain score is 4 or above: No action needed, pain <4.

## 2018-04-15 ENCOUNTER — Encounter: Payer: Self-pay | Admitting: Gastroenterology

## 2018-05-09 ENCOUNTER — Telehealth: Payer: Self-pay | Admitting: *Deleted

## 2018-05-09 NOTE — Telephone Encounter (Signed)
Received request for Medical Records from [facility]; forwarded to [place] for email/scana/SLS 08/05

## 2018-06-15 ENCOUNTER — Telehealth: Payer: Self-pay | Admitting: *Deleted

## 2018-06-15 NOTE — Telephone Encounter (Signed)
Received Medical/Surgical Clearance Form from [facility], pt's last OV/CPE 02/2016, last labs 11/2016; forwarded to provider/SLS 09/11

## 2018-06-16 NOTE — Telephone Encounter (Signed)
Patient is Tentatively scheduled for Ortho Surgery on 07/05/18, but needs need Med/Sx Clearance appointment w/labs with PCP prior to approval, d/t patient has not seen PCP in >2 years. Please call patient and schedule 40 minute appt if possible with PCP, if not, ask if he would mind seeing another provider. Thanks/SLS 09/12

## 2018-06-30 ENCOUNTER — Other Ambulatory Visit: Payer: Self-pay

## 2018-06-30 ENCOUNTER — Encounter (HOSPITAL_COMMUNITY): Payer: Self-pay | Admitting: *Deleted

## 2018-07-03 NOTE — H&P (Signed)
TOTAL HIP ADMISSION H&P  Patient is admitted for right total hip arthroplasty.  Subjective:  Chief Complaint: right hip pain  HPI: Jeremy Dean., 52 y.o. male, has a history of pain and functional disability in the right hip(s) due to arthritis and patient has failed non-surgical conservative treatments for greater than 12 weeks to include NSAID's and/or analgesics, corticosteriod injections, flexibility and strengthening excercises and activity modification.  Onset of symptoms was gradual starting 3 years ago with gradually worsening course since that time.The patient noted no past surgery on the right hip(s).  Patient currently rates pain in the right hip at 8 out of 10 with activity. Patient has night pain, worsening of pain with activity and weight bearing, pain that interfers with activities of daily living and pain with passive range of motion. Patient has evidence of periarticular osteophytes and joint space narrowing by imaging studies. This condition presents safety issues increasing the risk of falls. There is no current active infection.  Patient Active Problem List   Diagnosis Date Noted  . Preventative health care 03/03/2016  . Obesity (BMI 30-39.9) 08/07/2013  . Hip pain 08/07/2013   Past Medical History:  Diagnosis Date  . Arthritis   . Chronic kidney disease    kidney stone  . Genital warts    Hx of Genital warts  . History of blood clots    left leg after fracture 20+ years ago  . Hx of colonic polyps   . Hypertension    hx of years ago   . Migraines   . Non-alcoholic fatty liver disease     Past Surgical History:  Procedure Laterality Date  . COLONOSCOPY    . POLYPECTOMY    . WISDOM TOOTH EXTRACTION      Current Facility-Administered Medications  Medication Dose Route Frequency Provider Last Rate Last Dose  . 0.9 %  sodium chloride infusion  500 mL Intravenous Once Doran Stabler, MD       Current Outpatient Medications  Medication Sig Dispense  Refill Last Dose  . ibuprofen (ADVIL,MOTRIN) 200 MG tablet Take 800 mg by mouth every 6 (six) hours as needed (headaches).    04/11/2018  . indomethacin (INDOCIN) 25 MG capsule Take 1 capsule (25 mg total) by mouth every 12 (twelve) hours as needed. (Patient taking differently: Take 25 mg by mouth every 12 (twelve) hours as needed (hip pain). ) 60 capsule 0 Past Week  . traMADol (ULTRAM) 50 MG tablet Take 50 mg by mouth every 6 (six) hours as needed (for pain.).      No Known Allergies  Social History   Tobacco Use  . Smoking status: Former Smoker    Packs/day: 2.00    Years: 4.00    Pack years: 8.00    Types: Cigarettes    Last attempt to quit: 10/06/1983    Years since quitting: 34.7  . Smokeless tobacco: Never Used  Substance Use Topics  . Alcohol use: Yes    Comment: socially    Family History  Problem Relation Age of Onset  . Colon cancer Father   . Esophageal cancer Father   . Breast cancer Maternal Aunt   . Colon cancer Maternal Uncle 58  . Lung cancer Paternal Grandfather   . Breast cancer Paternal Aunt   . Stroke Maternal Grandfather   . Rectal cancer Neg Hx   . Stomach cancer Neg Hx      Review of Systems  Constitutional: Negative.   HENT: Negative.  Eyes: Negative.   Respiratory: Negative.   Cardiovascular: Negative.   Gastrointestinal: Negative.   Genitourinary: Negative.   Musculoskeletal: Positive for joint pain and myalgias. Negative for back pain, falls and neck pain.  Skin: Negative.   Neurological: Negative.   Endo/Heme/Allergies: Negative.   Psychiatric/Behavioral: Negative.     Objective:  Physical Exam  Constitutional: He is oriented to person, place, and time. He appears well-developed. No distress.  Morbidly obese  HENT:  Head: Normocephalic and atraumatic.  Right Ear: External ear normal.  Left Ear: External ear normal.  Nose: Nose normal.  Mouth/Throat: Oropharynx is clear and moist.  Eyes: Conjunctivae and EOM are normal.  Neck:  Normal range of motion. Neck supple.  Cardiovascular: Normal rate, regular rhythm, normal heart sounds and intact distal pulses.  No murmur heard. Respiratory: Effort normal and breath sounds normal. No respiratory distress. He has no wheezes.  GI: Soft. Bowel sounds are normal. He exhibits no distension. There is no tenderness.  Musculoskeletal:  Right Hip: No tenderness to palpation about the right greater trochanteric bursa. Pain with passive motion of the right hip, specifically internal rotation and adduction. ROM 100 degrees flexion, 0 degrees internal rotation, 0 degrees external rotation, and 5 degrees abduction.  Left Hip: No tenderness to palpation about the left greater trochanteric bursa. No pain with passive and active motion of the left hip. ROM 120 degrees flexion, 30 degrees internal rotation, 30 degrees external rotation, and 40 degrees abduction.  Neurological: He is alert and oriented to person, place, and time. He has normal strength. No sensory deficit.  Skin: He is not diaphoretic. No erythema.  Psychiatric: He has a normal mood and affect. His behavior is normal.    Ht: 5 ft 11 in  Wt: 257.4 lbs  BMI: 35.9  BP: 128/82 sitting L arm  Pulse: 76 bpm   Imaging Review Plain radiographs demonstrate severe degenerative joint disease of the right hip(s). The bone quality appears to be good for age and reported activity level.    Preoperative templating of the joint replacement has been completed, documented, and submitted to the Operating Room personnel in order to optimize intra-operative equipment management.     Assessment/Plan:  End stage primary osteoarthritis, right hip(s)  The patient history, physical examination, clinical judgement of the provider and imaging studies are consistent with end stage degenerative joint disease of the right hip(s) and total hip arthroplasty is deemed medically necessary. The treatment options including medical management,  injection therapy, arthroscopy and arthroplasty were discussed at length. The risks and benefits of total hip arthroplasty were presented and reviewed. The risks due to aseptic loosening, infection, stiffness, dislocation/subluxation,  thromboembolic complications and other imponderables were discussed.  The patient acknowledged the explanation, agreed to proceed with the plan and consent was signed. Patient is being admitted for inpatient treatment for surgery, pain control, PT, OT, prophylactic antibiotics, VTE prophylaxis, progressive ambulation and ADL's and discharge planning.The patient is planning to be discharged home.   Therapy Plans: HEP Disposition: Home with family (getting hotel in [place]; lives in [place]) DME needed: walker PCP: Dr. Etter Sjogren Other: no anesthesia concerns   Ardeen Jourdain, PA-C

## 2018-07-04 ENCOUNTER — Ambulatory Visit (HOSPITAL_COMMUNITY): Payer: Managed Care, Other (non HMO) | Admitting: Anesthesiology

## 2018-07-04 ENCOUNTER — Inpatient Hospital Stay (HOSPITAL_COMMUNITY)
Admission: RE | Admit: 2018-07-04 | Discharge: 2018-07-05 | DRG: 470 | Disposition: A | Discharge status: Home / Self Care | Payer: Managed Care, Other (non HMO) | Attending: Orthopedic Surgery | Admitting: Orthopedic Surgery

## 2018-07-04 ENCOUNTER — Encounter (HOSPITAL_COMMUNITY): Payer: Self-pay | Admitting: General Practice

## 2018-07-04 ENCOUNTER — Inpatient Hospital Stay (HOSPITAL_COMMUNITY): Payer: Managed Care, Other (non HMO)

## 2018-07-04 ENCOUNTER — Other Ambulatory Visit: Payer: Self-pay

## 2018-07-04 ENCOUNTER — Ambulatory Visit (HOSPITAL_COMMUNITY): Payer: Managed Care, Other (non HMO)

## 2018-07-04 ENCOUNTER — Encounter (HOSPITAL_COMMUNITY)
Admission: RE | Disposition: A | Discharge status: Home / Self Care | Payer: Self-pay | Source: Home / Self Care | Attending: Orthopedic Surgery

## 2018-07-04 DIAGNOSIS — Z96649 Presence of unspecified artificial hip joint: Secondary | ICD-10-CM

## 2018-07-04 DIAGNOSIS — Z79891 Long term (current) use of opiate analgesic: Secondary | ICD-10-CM | POA: Diagnosis not present

## 2018-07-04 DIAGNOSIS — M62838 Other muscle spasm: Secondary | ICD-10-CM | POA: Diagnosis not present

## 2018-07-04 DIAGNOSIS — Z79899 Other long term (current) drug therapy: Secondary | ICD-10-CM

## 2018-07-04 DIAGNOSIS — M169 Osteoarthritis of hip, unspecified: Secondary | ICD-10-CM

## 2018-07-04 DIAGNOSIS — I10 Essential (primary) hypertension: Secondary | ICD-10-CM | POA: Diagnosis present

## 2018-07-04 DIAGNOSIS — Z6835 Body mass index (BMI) 35.0-35.9, adult: Secondary | ICD-10-CM

## 2018-07-04 DIAGNOSIS — M1611 Unilateral primary osteoarthritis, right hip: Secondary | ICD-10-CM | POA: Diagnosis present

## 2018-07-04 DIAGNOSIS — Z801 Family history of malignant neoplasm of trachea, bronchus and lung: Secondary | ICD-10-CM | POA: Diagnosis not present

## 2018-07-04 DIAGNOSIS — M25751 Osteophyte, right hip: Secondary | ICD-10-CM | POA: Diagnosis present

## 2018-07-04 DIAGNOSIS — Z800 Family history of malignant neoplasm of digestive organs: Secondary | ICD-10-CM | POA: Diagnosis not present

## 2018-07-04 DIAGNOSIS — K760 Fatty (change of) liver, not elsewhere classified: Secondary | ICD-10-CM | POA: Diagnosis present

## 2018-07-04 DIAGNOSIS — Z87891 Personal history of nicotine dependence: Secondary | ICD-10-CM

## 2018-07-04 HISTORY — PX: TOTAL HIP ARTHROPLASTY: SHX124

## 2018-07-04 LAB — COMPREHENSIVE METABOLIC PANEL
ALK PHOS: 66 U/L (ref 38–126)
ALT: 44 U/L (ref 0–44)
AST: 25 U/L (ref 15–41)
Albumin: 4.6 g/dL (ref 3.5–5.0)
Anion gap: 9 (ref 5–15)
BILIRUBIN TOTAL: 1.4 mg/dL — AB (ref 0.3–1.2)
BUN: 12 mg/dL (ref 6–20)
CALCIUM: 9.5 mg/dL (ref 8.9–10.3)
CO2: 25 mmol/L (ref 22–32)
Chloride: 107 mmol/L (ref 98–111)
Creatinine, Ser: 0.96 mg/dL (ref 0.61–1.24)
GFR calc Af Amer: >60 mL/min (ref >60)
GLUCOSE: 133 mg/dL — AB (ref 70–99)
POTASSIUM: 4.1 mmol/L (ref 3.5–5.1)
Sodium: 141 mmol/L (ref 135–145)
TOTAL PROTEIN: 7.6 g/dL (ref 6.5–8.1)

## 2018-07-04 LAB — SURGICAL PCR SCREEN
MRSA, PCR: NEGATIVE
STAPHYLOCOCCUS AUREUS: POSITIVE — AB

## 2018-07-04 LAB — TYPE AND SCREEN
ABO/RH(D): O NEG
Antibody Screen: NEGATIVE

## 2018-07-04 LAB — CBC
HEMATOCRIT: 45.6 % (ref 39.0–52.0)
HEMOGLOBIN: 16.2 g/dL (ref 13.0–17.0)
MCH: 29.9 pg (ref 26.0–34.0)
MCHC: 35.5 g/dL (ref 30.0–36.0)
MCV: 84.3 fL (ref 78.0–100.0)
Platelets: 213 10*3/uL (ref 150–400)
RBC: 5.41 MIL/uL (ref 4.22–5.81)
RDW: 12.7 % (ref 11.5–15.5)
WBC: 7.9 10*3/uL (ref 4.0–10.5)

## 2018-07-04 LAB — APTT: aPTT: 31 seconds (ref 24–36)

## 2018-07-04 LAB — PROTIME-INR
INR: 0.94
Prothrombin Time: 12.5 seconds (ref 11.4–15.2)

## 2018-07-04 LAB — ABO/RH: ABO/RH(D): O NEG

## 2018-07-04 SURGERY — ARTHROPLASTY, HIP, TOTAL, ANTERIOR APPROACH
Anesthesia: Monitor Anesthesia Care | Site: Hip | Laterality: Right

## 2018-07-04 MED ORDER — ONDANSETRON HCL 4 MG PO TABS: 4.0000 mg | ORAL_TABLET | Freq: Four times a day (QID) | ORAL | Status: DC | PRN

## 2018-07-04 MED ORDER — MIDAZOLAM HCL 2 MG/2ML IJ SOLN
INTRAMUSCULAR | Status: AC
  Filled 2018-07-04: qty 2

## 2018-07-04 MED ORDER — FENTANYL CITRATE (PF) 100 MCG/2ML IJ SOLN
INTRAMUSCULAR | Status: AC
  Filled 2018-07-04: qty 2

## 2018-07-04 MED ORDER — PHENOL 1.4 % MT LIQD
1.0000 | OROMUCOSAL | Status: DC | PRN
  Filled 2018-07-04: qty 177

## 2018-07-04 MED ORDER — TRAMADOL HCL 50 MG PO TABS
50.0000 mg | ORAL_TABLET | Freq: Four times a day (QID) | ORAL | Status: DC | PRN
  Administered 2018-07-05: 100 mg via ORAL
  Filled 2018-07-04: qty 2

## 2018-07-04 MED ORDER — SODIUM CHLORIDE 0.9 % IV SOLN
INTRAVENOUS | Status: DC
  Administered 2018-07-04 – 2018-07-05 (×2): via INTRAVENOUS

## 2018-07-04 MED ORDER — ONDANSETRON HCL 4 MG/2ML IJ SOLN: 4.0000 mg | Freq: Four times a day (QID) | INTRAMUSCULAR | Status: DC | PRN

## 2018-07-04 MED ORDER — BUPIVACAINE IN DEXTROSE 0.75-8.25 % IT SOLN
INTRATHECAL | Status: DC | PRN
  Administered 2018-07-04: 2 mL via INTRATHECAL

## 2018-07-04 MED ORDER — ONDANSETRON HCL 4 MG/2ML IJ SOLN
INTRAMUSCULAR | Status: AC
  Filled 2018-07-04: qty 2

## 2018-07-04 MED ORDER — PHENYLEPHRINE 40 MCG/ML (10ML) SYRINGE FOR IV PUSH (FOR BLOOD PRESSURE SUPPORT)
PREFILLED_SYRINGE | INTRAVENOUS | Status: AC
  Filled 2018-07-04: qty 10

## 2018-07-04 MED ORDER — BUPIVACAINE-EPINEPHRINE (PF) 0.25% -1:200000 IJ SOLN
INTRAMUSCULAR | Status: DC | PRN
  Administered 2018-07-04: 30 mL

## 2018-07-04 MED ORDER — METHOCARBAMOL 500 MG IVPB - SIMPLE MED
INTRAVENOUS | Status: AC
  Administered 2018-07-04: 500 mg via INTRAVENOUS
  Filled 2018-07-04: qty 50

## 2018-07-04 MED ORDER — OXYCODONE HCL 5 MG PO TABS
5.0000 mg | ORAL_TABLET | ORAL | Status: DC | PRN
  Administered 2018-07-04 (×2): 5 mg via ORAL
  Administered 2018-07-05: 10 mg via ORAL
  Filled 2018-07-04: qty 2
  Filled 2018-07-04 (×2): qty 1
  Filled 2018-07-04: qty 2

## 2018-07-04 MED ORDER — STERILE WATER FOR IRRIGATION IR SOLN
Status: DC | PRN
  Administered 2018-07-04: 2000 mL

## 2018-07-04 MED ORDER — FENTANYL CITRATE (PF) 100 MCG/2ML IJ SOLN: 25.0000 ug | INTRAMUSCULAR | Status: DC | PRN

## 2018-07-04 MED ORDER — DIPHENHYDRAMINE HCL 12.5 MG/5ML PO ELIX: 12.5000 mg | ORAL_SOLUTION | ORAL | Status: DC | PRN

## 2018-07-04 MED ORDER — PROPOFOL 500 MG/50ML IV EMUL
INTRAVENOUS | Status: DC | PRN
  Administered 2018-07-04: 100 ug/kg/min via INTRAVENOUS

## 2018-07-04 MED ORDER — METHOCARBAMOL 500 MG PO TABS
500.0000 mg | ORAL_TABLET | Freq: Four times a day (QID) | ORAL | Status: DC | PRN
  Administered 2018-07-05 (×2): 500 mg via ORAL
  Filled 2018-07-04 (×2): qty 1

## 2018-07-04 MED ORDER — BISACODYL 10 MG RE SUPP: 10.0000 mg | Freq: Every day | RECTAL | Status: DC | PRN

## 2018-07-04 MED ORDER — DEXAMETHASONE SODIUM PHOSPHATE 10 MG/ML IJ SOLN
8.0000 mg | Freq: Once | INTRAMUSCULAR | Status: AC
  Administered 2018-07-04: 10 mg via INTRAVENOUS

## 2018-07-04 MED ORDER — PROPOFOL 10 MG/ML IV BOLUS
INTRAVENOUS | Status: DC | PRN
  Administered 2018-07-04: 20 ug via INTRAVENOUS

## 2018-07-04 MED ORDER — ACETAMINOPHEN 500 MG PO TABS
1000.0000 mg | ORAL_TABLET | Freq: Four times a day (QID) | ORAL | Status: AC
  Administered 2018-07-04 – 2018-07-05 (×4): 1000 mg via ORAL
  Filled 2018-07-04 (×5): qty 2

## 2018-07-04 MED ORDER — CEFAZOLIN SODIUM-DEXTROSE 2-4 GM/100ML-% IV SOLN
2.0000 g | Freq: Four times a day (QID) | INTRAVENOUS | Status: AC
  Administered 2018-07-04 – 2018-07-05 (×2): 2 g via INTRAVENOUS
  Filled 2018-07-04 (×2): qty 100

## 2018-07-04 MED ORDER — PROPOFOL 10 MG/ML IV BOLUS
INTRAVENOUS | Status: AC
  Filled 2018-07-04: qty 40

## 2018-07-04 MED ORDER — BUPIVACAINE-EPINEPHRINE (PF) 0.25% -1:200000 IJ SOLN
INTRAMUSCULAR | Status: AC
  Filled 2018-07-04: qty 30

## 2018-07-04 MED ORDER — DOCUSATE SODIUM 100 MG PO CAPS
100.0000 mg | ORAL_CAPSULE | Freq: Two times a day (BID) | ORAL | Status: DC
  Administered 2018-07-04 – 2018-07-05 (×2): 100 mg via ORAL
  Filled 2018-07-04 (×2): qty 1

## 2018-07-04 MED ORDER — 0.9 % SODIUM CHLORIDE (POUR BTL) OPTIME
TOPICAL | Status: DC | PRN
  Administered 2018-07-04: 1000 mL

## 2018-07-04 MED ORDER — RIVAROXABAN 10 MG PO TABS
10.0000 mg | ORAL_TABLET | Freq: Every day | ORAL | Status: DC
  Administered 2018-07-05: 10 mg via ORAL
  Filled 2018-07-04: qty 1

## 2018-07-04 MED ORDER — METOCLOPRAMIDE HCL 5 MG/ML IJ SOLN: 5.0000 mg | Freq: Three times a day (TID) | INTRAMUSCULAR | Status: DC | PRN

## 2018-07-04 MED ORDER — MENTHOL 3 MG MT LOZG: 1.0000 | LOZENGE | OROMUCOSAL | Status: DC | PRN

## 2018-07-04 MED ORDER — LIDOCAINE 2% (20 MG/ML) 5 ML SYRINGE
INTRAMUSCULAR | Status: AC
  Filled 2018-07-04: qty 5

## 2018-07-04 MED ORDER — FENTANYL CITRATE (PF) 250 MCG/5ML IJ SOLN
INTRAMUSCULAR | Status: DC | PRN
  Administered 2018-07-04 (×4): 50 ug via INTRAVENOUS

## 2018-07-04 MED ORDER — METOCLOPRAMIDE HCL 5 MG PO TABS: 5.0000 mg | ORAL_TABLET | Freq: Three times a day (TID) | ORAL | Status: DC | PRN

## 2018-07-04 MED ORDER — MIDAZOLAM HCL 5 MG/5ML IJ SOLN
INTRAMUSCULAR | Status: DC | PRN
  Administered 2018-07-04: 2 mg via INTRAVENOUS

## 2018-07-04 MED ORDER — CHLORHEXIDINE GLUCONATE 4 % EX LIQD: 60.0000 mL | Freq: Once | CUTANEOUS | Status: DC

## 2018-07-04 MED ORDER — METHOCARBAMOL 500 MG IVPB - SIMPLE MED
500.0000 mg | Freq: Four times a day (QID) | INTRAVENOUS | Status: DC | PRN
  Administered 2018-07-04: 500 mg via INTRAVENOUS
  Filled 2018-07-04: qty 50

## 2018-07-04 MED ORDER — ONDANSETRON HCL 4 MG/2ML IJ SOLN
INTRAMUSCULAR | Status: DC | PRN
  Administered 2018-07-04: 4 mg via INTRAVENOUS

## 2018-07-04 MED ORDER — FLEET ENEMA 7-19 GM/118ML RE ENEM: 1.0000 | ENEMA | Freq: Once | RECTAL | Status: DC | PRN

## 2018-07-04 MED ORDER — DEXAMETHASONE SODIUM PHOSPHATE 10 MG/ML IJ SOLN
INTRAMUSCULAR | Status: AC
  Filled 2018-07-04: qty 1

## 2018-07-04 MED ORDER — POLYETHYLENE GLYCOL 3350 17 G PO PACK: 17.0000 g | PACK | Freq: Every day | ORAL | Status: DC | PRN

## 2018-07-04 MED ORDER — OXYCODONE HCL 5 MG PO TABS
10.0000 mg | ORAL_TABLET | ORAL | Status: DC | PRN
  Administered 2018-07-05 (×3): 10 mg via ORAL
  Filled 2018-07-04 (×2): qty 3

## 2018-07-04 MED ORDER — ACETAMINOPHEN 10 MG/ML IV SOLN
1000.0000 mg | Freq: Four times a day (QID) | INTRAVENOUS | Status: DC
  Administered 2018-07-04: 1000 mg via INTRAVENOUS
  Filled 2018-07-04: qty 100

## 2018-07-04 MED ORDER — DEXAMETHASONE SODIUM PHOSPHATE 10 MG/ML IJ SOLN
10.0000 mg | Freq: Once | INTRAMUSCULAR | Status: AC
  Administered 2018-07-05: 10 mg via INTRAVENOUS
  Filled 2018-07-04: qty 1

## 2018-07-04 MED ORDER — CEFAZOLIN SODIUM-DEXTROSE 2-4 GM/100ML-% IV SOLN
2.0000 g | INTRAVENOUS | Status: AC
  Administered 2018-07-04: 2 g via INTRAVENOUS
  Filled 2018-07-04 (×2): qty 100

## 2018-07-04 MED ORDER — LACTATED RINGERS IV SOLN
INTRAVENOUS | Status: DC
  Administered 2018-07-04 (×2): via INTRAVENOUS

## 2018-07-04 MED ORDER — TRANEXAMIC ACID 1000 MG/10ML IV SOLN
1000.0000 mg | INTRAVENOUS | Status: AC
  Administered 2018-07-04: 1000 mg via INTRAVENOUS
  Filled 2018-07-04: qty 10

## 2018-07-04 MED ORDER — HYDROMORPHONE HCL 1 MG/ML IJ SOLN
0.5000 mg | INTRAMUSCULAR | Status: DC | PRN
  Administered 2018-07-04: 1 mg via INTRAVENOUS
  Filled 2018-07-04: qty 1

## 2018-07-04 SURGICAL SUPPLY — 45 items
BAG DECANTER FOR FLEXI CONT (MISCELLANEOUS) ×1 IMPLANT
BAG SPEC THK2 15X12 ZIP CLS (MISCELLANEOUS)
BAG ZIPLOCK 12X15 (MISCELLANEOUS) IMPLANT
BLADE SAG 18X100X1.27 (BLADE) ×2 IMPLANT
COVER PERINEAL POST (MISCELLANEOUS) ×2 IMPLANT
COVER SURGICAL LIGHT HANDLE (MISCELLANEOUS) ×2 IMPLANT
CUP ACET PINNACLE SECTR 56MM (Hips) IMPLANT
DECANTER SPIKE VIAL GLASS SM (MISCELLANEOUS) ×2 IMPLANT
DRAPE STERI IOBAN 125X83 (DRAPES) ×2 IMPLANT
DRAPE U-SHAPE 47X51 STRL (DRAPES) ×4 IMPLANT
DRSG ADAPTIC 3X8 NADH LF (GAUZE/BANDAGES/DRESSINGS) ×2 IMPLANT
DRSG MEPILEX BORDER 4X4 (GAUZE/BANDAGES/DRESSINGS) ×2 IMPLANT
DRSG MEPILEX BORDER 4X8 (GAUZE/BANDAGES/DRESSINGS) ×2 IMPLANT
DURAPREP 26ML APPLICATOR (WOUND CARE) ×2 IMPLANT
ELECT REM PT RETURN 15FT ADLT (MISCELLANEOUS) ×2 IMPLANT
EVACUATOR 1/8 PVC DRAIN (DRAIN) ×2 IMPLANT
GLOVE BIO SURGEON STRL SZ7 (GLOVE) ×2 IMPLANT
GLOVE BIO SURGEON STRL SZ8 (GLOVE) ×2 IMPLANT
GLOVE BIOGEL PI IND STRL 7.0 (GLOVE) ×1 IMPLANT
GLOVE BIOGEL PI IND STRL 7.5 (GLOVE) IMPLANT
GLOVE BIOGEL PI IND STRL 8 (GLOVE) ×1 IMPLANT
GLOVE BIOGEL PI INDICATOR 7.0 (GLOVE) ×2
GLOVE BIOGEL PI INDICATOR 7.5 (GLOVE) ×1
GLOVE BIOGEL PI INDICATOR 8 (GLOVE) ×1
GLOVE SURG SS PI 7.0 STRL IVOR (GLOVE) ×1 IMPLANT
GLOVE SURG SS PI 7.5 STRL IVOR (GLOVE) ×10 IMPLANT
GOWN STRL REUS W/TWL LRG LVL3 (GOWN DISPOSABLE) ×4 IMPLANT
GOWN STRL REUS W/TWL XL LVL3 (GOWN DISPOSABLE) ×3 IMPLANT
HEAD CERAMIC DELTA 36 PLUS 1.5 (Hips) ×1 IMPLANT
HOLDER FOLEY CATH W/STRAP (MISCELLANEOUS) ×2 IMPLANT
LINER MARATHON 4 NEUTRAL 36X56 (Hips) ×1 IMPLANT
MANIFOLD NEPTUNE II (INSTRUMENTS) ×2 IMPLANT
PACK ANTERIOR HIP CUSTOM (KITS) ×2 IMPLANT
PINNACLE SECTOR CUP 56MM (Hips) ×2 IMPLANT
STEM FEMORAL SZ6 HIGH ACTIS (Stem) ×1 IMPLANT
STRIP CLOSURE SKIN 1/2X4 (GAUZE/BANDAGES/DRESSINGS) ×2 IMPLANT
SUT ETHIBOND NAB CT1 #1 30IN (SUTURE) ×2 IMPLANT
SUT MNCRL AB 4-0 PS2 18 (SUTURE) ×2 IMPLANT
SUT STRATAFIX 0 PDS 27 VIOLET (SUTURE) ×2
SUT VIC AB 2-0 CT1 27 (SUTURE) ×4
SUT VIC AB 2-0 CT1 TAPERPNT 27 (SUTURE) ×2 IMPLANT
SUTURE STRATFX 0 PDS 27 VIOLET (SUTURE) ×1 IMPLANT
SYR 50ML LL SCALE MARK (SYRINGE) IMPLANT
TRAY FOLEY MTR SLVR 16FR STAT (SET/KITS/TRAYS/PACK) ×2 IMPLANT
YANKAUER SUCT BULB TIP 10FT TU (MISCELLANEOUS) ×2 IMPLANT

## 2018-07-04 NOTE — Discharge Instructions (Addendum)
°Dr. Frank Aluisio °Total Joint Specialist °[facility] °[address] °[place], [place] [address] °[phone] ° °ANTERIOR APPROACH TOTAL HIP REPLACEMENT POSTOPERATIVE DIRECTIONS ° ° °Hip Rehabilitation, Guidelines Following Surgery  °The results of a hip operation are greatly improved after range of motion and muscle strengthening exercises. Follow all safety measures which are given to protect your hip. If any of these exercises cause increased pain or swelling in your joint, decrease the amount until you are comfortable again. Then slowly increase the exercises. Call your caregiver if you have problems or questions.  ° °HOME CARE INSTRUCTIONS  °• Remove items at home which could result in a fall. This includes throw rugs or furniture in walking pathways.  °· ICE to the affected hip every three hours for 30 minutes at a time and then as needed for pain and swelling.  Continue to use ice on the hip for pain and swelling from surgery. You may notice swelling that will progress down to the foot and ankle.  This is normal after surgery.  Elevate the leg when you are not up walking on it.   °· Continue to use the breathing machine which will help keep your temperature down.  It is common for your temperature to cycle up and down following surgery, especially at night when you are not up moving around and exerting yourself.  The breathing machine keeps your lungs expanded and your temperature down. ° °DIET °You may resume your previous home diet once your are discharged from the hospital. ° °DRESSING / WOUND CARE / SHOWERING °You may shower 3 days after surgery, but keep the wounds dry during showering.  You may use an occlusive plastic wrap (Press'n Seal for example), NO SOAKING/SUBMERGING IN THE BATHTUB.  If the bandage gets wet, change with a clean dry gauze.  If the incision gets wet, pat the wound dry with a clean towel. °You may start showering once you are discharged home but do not submerge the  incision under water. Just pat the incision dry and apply a dry gauze dressing on daily. °Change the surgical dressing daily and reapply a dry dressing each time. ° °ACTIVITY °Walk with your walker as instructed. °Use walker as long as suggested by your caregivers. °Avoid periods of inactivity such as sitting longer than an hour when not asleep. This helps prevent blood clots.  °You may resume a sexual relationship in one month or when given the OK by your doctor.  °You may return to work once you are cleared by your doctor.  °Do not drive a car for 6 weeks or until released by you surgeon.  °Do not drive while taking narcotics. ° °WEIGHT BEARING °Weight bearing as tolerated with assist device (walker, cane, etc) as directed, use it as long as suggested by your surgeon or therapist, typically at least 4-6 weeks. ° °POSTOPERATIVE CONSTIPATION PROTOCOL °Constipation - defined medically as fewer than three stools per week and severe constipation as less than one stool per week. ° °One of the most common issues patients have following surgery is constipation.  Even if you have a regular bowel pattern at home, your normal regimen is likely to be disrupted due to multiple reasons following surgery.  Combination of anesthesia, postoperative narcotics, change in appetite and fluid intake all can affect your bowels.  In order to avoid complications following surgery, here are some recommendations in order to help you during your recovery period. ° °Colace (docusate) - Pick up an over-the-counter form   of Colace or another stool softener and take twice a day as long as you are requiring postoperative pain medications.  Take with a full glass of water daily.  If you experience loose stools or diarrhea, hold the colace until you stool forms back up.  If your symptoms do not get better within 1 week or if they get worse, check with your doctor. ° °Dulcolax (bisacodyl) - Pick up over-the-counter and take as directed by the product  packaging as needed to assist with the movement of your bowels.  Take with a full glass of water.  Use this product as needed if not relieved by Colace only.  ° °MiraLax (polyethylene glycol) - Pick up over-the-counter to have on hand.  MiraLax is a solution that will increase the amount of water in your bowels to assist with bowel movements.  Take as directed and can mix with a glass of water, juice, soda, coffee, or tea.  Take if you go more than two days without a movement. °Do not use MiraLax more than once per day. Call your doctor if you are still constipated or irregular after using this medication for 7 days in a row. ° °If you continue to have problems with postoperative constipation, please contact the office for further assistance and recommendations.  If you experience "the worst abdominal pain ever" or develop nausea or vomiting, please contact the office immediatly for further recommendations for treatment. ° °ITCHING ° If you experience itching with your medications, try taking only a single pain pill, or even half a pain pill at a time.  You can also use Benadryl over the counter for itching or also to help with sleep.  ° °TED HOSE STOCKINGS °Wear the elastic stockings on both legs for three weeks following surgery during the day but you may remove then at night for sleeping. ° °MEDICATIONS °See your medication summary on the “After Visit Summary” that the nursing staff will review with you prior to discharge.  You may have some home medications which will be placed on hold until you complete the course of blood thinner medication.  It is important for you to complete the blood thinner medication as prescribed by your surgeon.  Continue your approved medications as instructed at time of discharge. ° °PRECAUTIONS °If you experience chest pain or shortness of breath - call 911 immediately for transfer to the hospital emergency department.  °If you develop a fever greater that 101 F, purulent drainage  from wound, increased redness or drainage from wound, foul odor from the wound/dressing, or calf pain - CONTACT YOUR SURGEON.   °                                                °FOLLOW-UP APPOINTMENTS °Make sure you keep all of your appointments after your operation with your surgeon and caregivers. You should call the office at the above phone number and make an appointment for approximately two weeks after the date of your surgery or on the date instructed by your surgeon outlined in the "After Visit Summary". ° °RANGE OF MOTION AND STRENGTHENING EXERCISES  °These exercises are designed to help you keep full movement of your hip joint. Follow your caregiver's or physical therapist's instructions. Perform all exercises about fifteen times, three times per day or as directed. Exercise both hips, even if you have   had only one joint replacement. These exercises can be done on a training (exercise) mat, on the floor, on a table or on a bed. Use whatever works the best and is most comfortable for you. Use music or television while you are exercising so that the exercises are a pleasant break in your day. This will make your life better with the exercises acting as a break in routine you can look forward to.  °• Lying on your back, slowly slide your foot toward your buttocks, raising your knee up off the floor. Then slowly slide your foot back down until your leg is straight again.  °• Lying on your back spread your legs as far apart as you can without causing discomfort.  °• Lying on your side, raise your upper leg and foot straight up from the floor as far as is comfortable. Slowly lower the leg and repeat.  °• Lying on your back, tighten up the muscle in the front of your thigh (quadriceps muscles). You can do this by keeping your leg straight and trying to raise your heel off the floor. This helps strengthen the largest muscle supporting your knee.  °• Lying on your back, tighten up the muscles of your buttocks both  with the legs straight and with the knee bent at a comfortable angle while keeping your heel on the floor.  ° °IF YOU ARE TRANSFERRED TO A SKILLED REHAB FACILITY °If the patient is transferred to a skilled rehab facility following release from the hospital, a list of the current medications will be sent to the facility for the patient to continue.  When discharged from the skilled rehab facility, please have the facility set up the patient's Home Health Physical Therapy prior to being released. Also, the skilled facility will be responsible for providing the patient with their medications at time of release from the facility to include their pain medication, the muscle relaxants, and their blood thinner medication. If the patient is still at the rehab facility at time of the two week follow up appointment, the skilled rehab facility will also need to assist the patient in arranging follow up appointment in our office and any transportation needs. ° °MAKE SURE YOU:  °• Understand these instructions.  °• Get help right away if you are not doing well or get worse.  ° ° °Pick up stool softner and laxative for home use following surgery while on pain medications. °Do not submerge incision under water. °Please use good hand washing techniques while changing dressing each day. °May shower starting three days after surgery. °Please use a clean towel to pat the incision dry following showers. °Continue to use ice for pain and swelling after surgery. °Do not use any lotions or creams on the incision until instructed by your surgeon. ° °Information on my medicine - XARELTO® (Rivaroxaban) ° °Why was Xarelto® prescribed for you? °Xarelto® was prescribed for you to reduce the risk of blood clots forming after orthopedic surgery. The medical term for these abnormal blood clots is venous thromboembolism (VTE). ° °What do you need to know about xarelto® ? °Take your Xarelto® ONCE DAILY at the same time every day. °You may take it  either with or without food. ° °If you have difficulty swallowing the tablet whole, you may crush it and mix in applesauce just prior to taking your dose. ° °Take Xarelto® exactly as prescribed by your doctor and DO NOT stop taking Xarelto® without talking to the doctor who prescribed the medication.    Stopping without other VTE prevention medication to take the place of Xarelto® may increase your risk of developing a clot. ° °After discharge, you should have regular check-up appointments with your healthcare provider that is prescribing your Xarelto®.   ° °What do you do if you miss a dose? °If you miss a dose, take it as soon as you remember on the same day then continue your regularly scheduled once daily regimen the next day. Do not take two doses of Xarelto® on the same day.  ° °Important Safety Information °A possible side effect of Xarelto® is bleeding. You should call your healthcare provider right away if you experience any of the following: °? Bleeding from an injury or your nose that does not stop. °? Unusual colored urine (red or dark brown) or unusual colored stools (red or black). °? Unusual bruising for unknown reasons. °? A serious fall or if you hit your head (even if there is no bleeding). ° °Some medicines may interact with Xarelto® and might increase your risk of bleeding while on Xarelto®. To help avoid this, consult your healthcare provider or pharmacist prior to using any new prescription or non-prescription medications, including herbals, vitamins, non-steroidal anti-inflammatory drugs (NSAIDs) and supplements. ° °This website has more information on Xarelto®: www.xarelto.com. ° ° ° °

## 2018-07-04 NOTE — Anesthesia Postprocedure Evaluation (Signed)
Anesthesia Post Note  Patient: Jeremy Dean.  Procedure(s) Performed: RIGHT TOTAL HIP ARTHROPLASTY ANTERIOR APPROACH (Right Hip)     Patient location during evaluation: PACU Anesthesia Type: MAC and Spinal Level of consciousness: awake and alert Pain management: pain level controlled Vital Signs Assessment: post-procedure vital signs reviewed and stable Respiratory status: spontaneous breathing and respiratory function stable Cardiovascular status: blood pressure returned to baseline and stable Postop Assessment: spinal receding Anesthetic complications: no    Last Vitals:  Vitals:   07/04/18 1901 07/04/18 2003  BP: 102/71 100/72  Pulse: 89 90  Resp: 14 15  Temp: 36.7 C 36.7 C  SpO2: 97% 98%    Last Pain:  Vitals:   07/04/18 2003  TempSrc: Oral  PainSc:                  Tiajuana Amass

## 2018-07-04 NOTE — Anesthesia Preprocedure Evaluation (Addendum)
Anesthesia Evaluation  Patient identified by MRN, date of birth, ID band Patient awake    Reviewed: Allergy & Precautions, NPO status , Patient's Chart, lab work & pertinent test results  Airway Mallampati: II  TM Distance: >3 FB Neck ROM: Full    Dental   Pulmonary former smoker,    breath sounds clear to auscultation       Cardiovascular hypertension, Pt. on medications  Rhythm:Regular Rate:Normal     Neuro/Psych  Headaches,    GI/Hepatic negative GI ROS, Neg liver ROS,   Endo/Other  negative endocrine ROS  Renal/GU Renal disease     Musculoskeletal  (+) Arthritis ,   Abdominal   Peds  Hematology negative hematology ROS (+)   Anesthesia Other Findings   Reproductive/Obstetrics                            Lab Results  Component Value Date   WBC 7.9 07/04/2018   HGB 16.2 07/04/2018   HCT 45.6 07/04/2018   MCV 84.3 07/04/2018   PLT 213 07/04/2018   Lab Results  Component Value Date   CREATININE 1.17 11/18/2016   BUN 14 11/18/2016   NA 138 11/18/2016   K 3.7 11/18/2016   CL 103 11/18/2016   CO2 30 11/18/2016   Lab Results  Component Value Date   INR 0.94 07/04/2018    Anesthesia Physical Anesthesia Plan  ASA: II  Anesthesia Plan: MAC and Spinal   Post-op Pain Management:    Induction: Intravenous  PONV Risk Score and Plan: 1 and Propofol infusion, Ondansetron and Treatment may vary due to age or medical condition  Airway Management Planned: Natural Airway and Simple Face Mask  Additional Equipment:   Intra-op Plan:   Post-operative Plan:   Informed Consent: I have reviewed the patients History and Physical, chart, labs and discussed the procedure including the risks, benefits and alternatives for the proposed anesthesia with the patient or authorized representative who has indicated his/her understanding and acceptance.     Plan Discussed with:  CRNA  Anesthesia Plan Comments:         Anesthesia Quick Evaluation

## 2018-07-04 NOTE — Anesthesia Procedure Notes (Signed)
Spinal  Start time: 07/04/2018 1:47 PM End time: 07/04/2018 1:52 PM Staffing Anesthesiologist: Suzette Battiest, MD Performed: anesthesiologist  Preanesthetic Checklist Completed: patient identified, site marked, surgical consent, pre-op evaluation, timeout performed, IV checked, risks and benefits discussed and monitors and equipment checked Spinal Block Patient position: sitting Prep: site prepped and draped and DuraPrep Patient monitoring: blood pressure, continuous pulse ox and heart rate Approach: midline Location: L4-5 Injection technique: single-shot Needle Needle type: Pencan  Needle gauge: 24 G Needle length: 9 cm

## 2018-07-04 NOTE — Plan of Care (Signed)
Plan of care 

## 2018-07-04 NOTE — Interval H&P Note (Signed)
History and Physical Interval Note:  07/04/2018 11:47 AM  Jeremy Dean.  has presented today for surgery, with the diagnosis of right hip osteoarthritis  The various methods of treatment have been discussed with the patient and family. After consideration of risks, benefits and other options for treatment, the patient has consented to  Procedure(s): RIGHT TOTAL HIP ARTHROPLASTY ANTERIOR APPROACH (Right) as a surgical intervention .  The patient's history has been reviewed, patient examined, no change in status, stable for surgery.  I have reviewed the patient's chart and labs.  Questions were answered to the patient's satisfaction.     Pilar Plate Josefa [place]

## 2018-07-04 NOTE — Op Note (Signed)
OPERATIVE REPORT- TOTAL HIP ARTHROPLASTY   PREOPERATIVE DIAGNOSIS: Osteoarthritis of the Right hip.   POSTOPERATIVE DIAGNOSIS: Osteoarthritis of the Right  hip.   PROCEDURE: Right total hip arthroplasty, anterior approach.   SURGEON: Gaynelle Arabian, MD   ASSISTANT: Theresa Duty, PA-C  ANESTHESIA:  Spinal  ESTIMATED BLOOD LOSS:-550 mL    DRAINS: Hemovac x1.   COMPLICATIONS: None   CONDITION: PACU - hemodynamically stable.   BRIEF CLINICAL NOTE: Jeremy Dean. is a 52 y.o. male who has advanced end-  stage arthritis of their Right  hip with progressively worsening pain and  dysfunction.The patient has failed nonoperative management and presents for  total hip arthroplasty.   PROCEDURE IN DETAIL: After successful administration of spinal  anesthetic, the traction boots for the [facility] bed were placed on both  feet and the patient was placed onto the [facility] bed, boots placed into the leg  holders. The Right hip was then isolated from the perineum with plastic  drapes and prepped and draped in the usual sterile fashion. ASIS and  greater trochanter were marked and a oblique incision was made, starting  at about 1 cm lateral and 2 cm distal to the ASIS and coursing towards  the anterior cortex of the femur. The skin was cut with a 10 blade  through subcutaneous tissue to the level of the fascia overlying the  tensor fascia lata muscle. The fascia was then incised in line with the  incision at the junction of the anterior third and posterior 2/3rd. The  muscle was teased off the fascia and then the interval between the TFL  and the rectus was developed. The Hohmann retractor was then placed at  the top of the femoral neck over the capsule. The vessels overlying the  capsule were cauterized and the fat on top of the capsule was removed.  A Hohmann retractor was then placed anterior underneath the rectus  femoris to give exposure to the entire anterior capsule. A T-shaped   capsulotomy was performed. The edges were tagged and the femoral head  was identified.       Osteophytes are removed off the superior acetabulum.  The femoral neck was then cut in situ with an oscillating saw. Traction  was then applied to the left lower extremity utilizing the [facility]  traction. The femoral head was then removed. Retractors were placed  around the acetabulum and then circumferential removal of the labrum was  performed. Osteophytes were also removed. Reaming starts at 51 mm to  medialize and  Increased in 2 mm increments to 55 mm. We reamed in  approximately 40 degrees of abduction, 20 degrees anteversion. A 56 mm  pinnacle acetabular shell was then impacted in anatomic position under  fluoroscopic guidance with excellent purchase. We did not need to place  any additional dome screws. A 36 mm neutral + 4 marathon liner was then  placed into the acetabular shell.       The femoral lift was then placed along the lateral aspect of the femur  just distal to the vastus ridge. The leg was  externally rotated and capsule  was stripped off the inferior aspect of the femoral neck down to the  level of the lesser trochanter, this was done with electrocautery. The femur was lifted after this was performed. The  leg was then placed in an extended and adducted position essentially delivering the femur. We also removed the capsule superiorly and the piriformis from the  piriformis fossa to gain excellent exposure of the  proximal femur. Rongeur was used to remove some cancellous bone to get  into the lateral portion of the proximal femur for placement of the  initial starter reamer. The starter broaches was placed  the starter broach  and was shown to go down the center of the canal. Broaching  with the Actis system was then performed starting at size 0  coursing  Up to size 6. A size 6 had excellent torsional and rotational  and axial stability. The trial high offset neck was then placed   with a 36 + 1.5 trial head. The hip was then reduced. We confirmed that  the stem was in the canal both on AP and lateral x-rays. It also has excellent sizing. The hip was reduced with outstanding stability through full extension and full external rotation.. AP pelvis was taken and the leg lengths were measured and found to be equal. Hip was then dislocated again and the femoral head and neck removed. The  femoral broach was removed. Size 6 Actis stem with a high offset  neck was then impacted into the femur following native anteversion. Has  excellent purchase in the canal. Excellent torsional and rotational and  axial stability. It is confirmed to be in the canal on AP and lateral  fluoroscopic views. The 36 + 1.5 ceramic head was placed and the hip  reduced with outstanding stability. Again AP pelvis was taken and it  confirmed that the leg lengths were equal. The wound was then copiously  irrigated with saline solution and the capsule reattached and repaired  with Ethibond suture. 30 ml of .25% Bupivicaine was  injected into the capsule and into the edge of the tensor fascia lata as well as subcutaneous tissue. The fascia overlying the tensor fascia lata was then closed with a running #1 V-Loc. Subcu was closed with interrupted 2-0 Vicryl and subcuticular running 4-0 Monocryl. Incision was cleaned  and dried. Steri-Strips and a bulky sterile dressing applied. Hemovac  drain was hooked to suction and then the patient was awakened and transported to  recovery in stable condition.        Please note that a surgical assistant was a medical necessity for this procedure to perform it in a safe and expeditious manner. Assistant was necessary to provide appropriate retraction of vital neurovascular structures and to prevent femoral fracture and allow for anatomic placement of the prosthesis.  Gaynelle Arabian, M.D.

## 2018-07-04 NOTE — Transfer of Care (Signed)
Immediate Anesthesia Transfer of Care Note  Patient: Jeremy Dean.  Procedure(s) Performed: RIGHT TOTAL HIP ARTHROPLASTY ANTERIOR APPROACH (Right Hip)  Patient Location: PACU  Anesthesia Type:MAC and Spinal  Level of Consciousness: awake, alert  and oriented  Airway & Oxygen Therapy: Patient Spontanous Breathing and Patient connected to nasal cannula oxygen  Post-op Assessment: Report given to RN and Post -op Vital signs reviewed and stable  Post vital signs: Reviewed and stable  Last Vitals:  Vitals Value Taken Time  BP    Temp    Pulse 78 07/04/2018  3:58 PM  Resp 11 07/04/2018  3:58 PM  SpO2 99 % 07/04/2018  3:58 PM  Vitals shown include unvalidated device data.  Last Pain:  Vitals:   07/04/18 1208  TempSrc:   PainSc: 0-No pain         Complications: No apparent anesthesia complications

## 2018-07-05 ENCOUNTER — Encounter (HOSPITAL_COMMUNITY): Payer: Self-pay | Admitting: Orthopedic Surgery

## 2018-07-05 LAB — BASIC METABOLIC PANEL
ANION GAP: 6 (ref 5–15)
BUN: 15 mg/dL (ref 6–20)
CALCIUM: 9 mg/dL (ref 8.9–10.3)
CO2: 26 mmol/L (ref 22–32)
CREATININE: 1.06 mg/dL (ref 0.61–1.24)
Chloride: 103 mmol/L (ref 98–111)
Glucose, Bld: 206 mg/dL — ABNORMAL HIGH (ref 70–99)
Potassium: 5.2 mmol/L — ABNORMAL HIGH (ref 3.5–5.1)
Sodium: 135 mmol/L (ref 135–145)

## 2018-07-05 LAB — CBC
HEMATOCRIT: 38.8 % — AB (ref 39.0–52.0)
HEMOGLOBIN: 13.4 g/dL (ref 13.0–17.0)
MCH: 29.5 pg (ref 26.0–34.0)
MCHC: 34.5 g/dL (ref 30.0–36.0)
MCV: 85.5 fL (ref 78.0–100.0)
Platelets: 231 10*3/uL (ref 150–400)
RBC: 4.54 MIL/uL (ref 4.22–5.81)
RDW: 12.7 % (ref 11.5–15.5)
WBC: 14.3 10*3/uL — ABNORMAL HIGH (ref 4.0–10.5)

## 2018-07-05 MED ORDER — TRAMADOL HCL 50 MG PO TABS
50.0000 mg | ORAL_TABLET | Freq: Four times a day (QID) | ORAL | Status: DC | PRN
  Filled 2018-07-05: qty 2

## 2018-07-05 MED ORDER — CYCLOBENZAPRINE HCL 10 MG PO TABS: 10.0000 mg | ORAL_TABLET | Freq: Three times a day (TID) | ORAL | 0 refills | Status: DC | PRN

## 2018-07-05 MED ORDER — RIVAROXABAN 10 MG PO TABS: 10.0000 mg | ORAL_TABLET | Freq: Every day | ORAL | 0 refills | Status: DC

## 2018-07-05 MED ORDER — OXYCODONE HCL 5 MG PO TABS: 5.0000 mg | ORAL_TABLET | Freq: Four times a day (QID) | ORAL | 0 refills | Status: DC | PRN

## 2018-07-05 MED ORDER — CYCLOBENZAPRINE HCL 10 MG PO TABS
10.0000 mg | ORAL_TABLET | Freq: Three times a day (TID) | ORAL | Status: DC | PRN
  Administered 2018-07-05: 10 mg via ORAL
  Filled 2018-07-05 (×2): qty 1

## 2018-07-05 NOTE — Progress Notes (Signed)
   Subjective: 1 Day Post-Op Procedure(s) (LRB): RIGHT TOTAL HIP ARTHROPLASTY ANTERIOR APPROACH (Right) Patient reports pain as moderate.   Patient seen in rounds by Dr. Wynelle Link. Patient is well, and has had no acute complaints or problems other than pain in the right hip. Endorses frequent muscle spasms. Denies chest pain or SOB. Foley catheter removed this AM. No issues overnight. We will start therapy today.   Objective: Vital signs in last 24 hours: Temp:  [97.5 F (36.4 C)-98.3 F (36.8 C)] 98 F (36.7 C) (10/01 0550) Pulse Rate:  [73-90] 78 (10/01 0550) Resp:  [10-19] 18 (10/01 0550) BP: (88-146)/(68-103) 125/87 (10/01 0550) SpO2:  [96 %-100 %] 98 % (10/01 0550) Weight:  [115.5 kg] 115.5 kg (09/30 1700)  Intake/Output from previous day:  Intake/Output Summary (Last 24 hours) at 07/05/2018 0737 Last data filed at 07/05/2018 0600 Gross per 24 hour  Intake 3421 ml  Output 1500 ml  Net 1921 ml    Labs: Recent Labs    07/04/18 1207 07/05/18 0527  HGB 16.2 13.4   Recent Labs    07/04/18 1207 07/05/18 0527  WBC 7.9 14.3*  RBC 5.41 4.54  HCT 45.6 38.8*  PLT 213 231   Recent Labs    07/04/18 1207 07/05/18 0527  NA 141 135  K 4.1 5.2*  CL 107 103  CO2 25 26  BUN 12 15  CREATININE 0.96 1.06  GLUCOSE 133* 206*  CALCIUM 9.5 9.0   Recent Labs    07/04/18 1207  INR 0.94    Exam: General - Patient is Alert and Oriented Extremity - Neurologically intact Neurovascular intact Sensation intact distally Dorsiflexion/Plantar flexion intact Dressing - dressing C/D/I Motor Function - intact, moving foot and toes well on exam.   Past Medical History:  Diagnosis Date  . Arthritis   . Chronic kidney disease    kidney stone  . Genital warts    Hx of Genital warts  . History of blood clots    left leg after fracture 20+ years ago  . Hx of colonic polyps   . Hypertension    hx of years ago   . Migraines   . Non-alcoholic fatty liver disease      Assessment/Plan: 1 Day Post-Op Procedure(s) (LRB): RIGHT TOTAL HIP ARTHROPLASTY ANTERIOR APPROACH (Right) Principal Problem:   OA (osteoarthritis) of hip  Estimated body mass index is 35.51 kg/m as calculated from the following:   Height as of this encounter: 5\' 11"  (1.803 m).   Weight as of this encounter: 115.5 kg. Advance diet Up with therapy D/C IV fluids  DVT Prophylaxis - Xarelto Weight bearing as tolerated. D/C O2 and pulse ox and try on room air. Hemovac pulled without difficulty, will begin therapy.  Methocarbamol switched to flexeril. May need to switch oxycodone to dilaudid PO tablets if still having difficulties managing pain. Plan is to go Home after hospital stay. Plan for discharge to home later today with home exercise program. Follow-up in the office in 2 weeks with Dr. Wynelle Link.  Theresa Duty, PA-C Orthopedic Surgery 07/05/2018, 7:37 AM

## 2018-07-05 NOTE — Evaluation (Signed)
Physical Therapy Evaluation Patient Details Name: Jeremy Dean. MRN: [id number] DOB: 09-02-1966 Today's Date: 07/05/2018   History of Present Illness  52 yo male s/p R THA-DA 07/04/18  Clinical Impression  On eval, pt was MIn guard-Min assist for mobility. He walked ~100 feet with a RW. Pain rated 7/10 with activity. Will plan to have a 2nd session today to practice stair negotiation.     Follow Up Recommendations Follow surgeon's recommendation for DC plan and follow-up therapies    Equipment Recommendations  Rolling walker with 5" wheels;3in1 (PT)    Recommendations for Other Services       Precautions / Restrictions Precautions Precautions: Fall Restrictions Weight Bearing Restrictions: No      Mobility  Bed Mobility Overal bed mobility: Needs Assistance Bed Mobility: Supine to Sit     Supine to sit: Min guard;HOB elevated Sit to Supine: Min assist. HOB elevated     General bed mobility comments: Close guard for safety. Increased time.   Transfers Overall transfer level: Needs assistance Equipment used: Rolling walker (2 wheeled) Transfers: Sit to/from Stand Sit to Stand: From elevated surface;Min guard         General transfer comment: Close guard for safety. VCs safety, hand/LE placement.   Ambulation/Gait Ambulation/Gait assistance: Min guard Gait Distance (Feet): 100 Feet Assistive device: Rolling walker (2 wheeled) Gait Pattern/deviations: Step-to pattern;Step-through pattern;Decreased stride length     General Gait Details: Close guard for safety. Slow gait speed. VCs safety, sequence  Stairs            Wheelchair Mobility    Modified Rankin (Stroke Patients Only)       Balance Overall balance assessment: Mild deficits observed, not formally tested                                           Pertinent Vitals/Pain Pain Assessment: 0-10 Pain Score: 7  Pain Location: R hip Pain Descriptors / Indicators:  Sore Pain Intervention(s): Monitored during session    Home Living Family/patient expects to be discharged to:: Private residence Living Arrangements: Non-relatives/Friends   Type of Home: Apartment Home Access: Stairs to enter Entrance Stairs-Rails: Left;Right;Can reach both Technical brewer of Steps: 1 flight Home Layout: One level Home Equipment: Cane - single point      Prior Function Level of Independence: Independent               Hand Dominance        Extremity/Trunk Assessment   Upper Extremity Assessment Upper Extremity Assessment: Overall WFL for tasks assessed    Lower Extremity Assessment Lower Extremity Assessment: Generalized weakness(s/p R THA)    Cervical / Trunk Assessment Cervical / Trunk Assessment: Normal  Communication   Communication: No difficulties  Cognition Arousal/Alertness: Awake/alert Behavior During Therapy: WFL for tasks assessed/performed Overall Cognitive Status: Within Functional Limits for tasks assessed                                        General Comments      Exercises Total Joint Exercises Ankle Circles/Pumps: AROM;Both;10 reps;Supine Quad Sets: AROM;Both;10 reps;Supine Heel Slides: AAROM;Right;10 reps;Supine Hip ABduction/ADduction: AAROM;Right;10 reps;Supine   Assessment/Plan    PT Assessment Patient needs continued PT services  PT Problem List Decreased strength;Decreased mobility;Decreased range of motion;Decreased activity  tolerance;Decreased balance;Decreased knowledge of use of DME;Pain       PT Treatment Interventions Gait training;Therapeutic activities;DME instruction;Functional mobility training;Balance training;Patient/family education;Therapeutic exercise;Stair training    PT Goals (Current goals can be found in the Care Plan section)  Acute Rehab PT Goals Patient Stated Goal: home today PT Goal Formulation: With patient Time For Goal Achievement: 07/19/18 Potential to  Achieve Goals: Good    Frequency 7X/week   Barriers to discharge        Co-evaluation               AM-PAC PT "6 Clicks" Daily Activity  Outcome Measure Difficulty turning over in bed (including adjusting bedclothes, sheets and blankets)?: A Little Difficulty moving from lying on back to sitting on the side of the bed? : A Little Difficulty sitting down on and standing up from a chair with arms (e.g., wheelchair, bedside commode, etc,.)?: A Little Help needed moving to and from a bed to chair (including a wheelchair)?: A Little Help needed walking in hospital room?: A Little Help needed climbing 3-5 steps with a railing? : A Little 6 Click Score: 18    End of Session Equipment Utilized During Treatment: Gait belt Activity Tolerance: Patient tolerated treatment well Patient left: in bed;with call bell/phone within reach   PT Visit Diagnosis: Pain;Other abnormalities of gait and mobility (R26.89) Pain - Right/Left: Right Pain - part of body: Hip    Time: 8453-6468 PT Time Calculation (min) (ACUTE ONLY): 27 min   Charges:   PT Evaluation $PT Eval Low Complexity: 1 Low PT Treatments $Gait Training: 8-22 mins          Weston Anna, PT Acute Rehabilitation Services Pager: [phone] Office: [phone]

## 2018-07-05 NOTE — Care Management Note (Signed)
Case Management Note  Patient Details  Name: Jeremy Dean. MRN: [id number] Date of Birth: 1966/01/16  Subjective/Objective:     Spoke with patient at bedside. Confirmed plan for HEP. Needs a RW and 3n1, contacted [facility] to deliver to the room. [phone]               Action/Plan:   Expected Discharge Date:  07/05/18               Expected Discharge Plan:  Home/Self Care  In-House Referral:  NA  Discharge planning Services  CM Consult  Post Acute Care Choice:  Durable Medical Equipment Choice offered to:  Patient  DME Arranged:  3-N-1, Walker rolling DME Agency:  [place]:  NA [place] Agency:  NA  Status of Service:  Completed, signed off  If discussed at [place] of Stay Meetings, dates discussed:    Additional Comments:  Guadalupe Maple, RN 07/05/2018, 10:46 AM

## 2018-07-05 NOTE — Progress Notes (Signed)
Physical Therapy Treatment Patient Details Name: Jeremy Dean. MRN: [id number] DOB: 01-09-1966 Today's Date: 07/05/2018    History of Present Illness 52 yo male s/p R THA-DA 07/04/18    PT Comments    Progressing with mobility. Reviewed/practiced exercises, gait training, and stair training. Issued HEP for pt to perform 2x/day. Okay to d/c from PT standpoint-made RN aware.    Follow Up Recommendations  Follow surgeon's recommendation for DC plan and follow-up therapies     Equipment Recommendations  Rolling walker with 5" wheels;3in1 (PT)    Recommendations for Other Services       Precautions / Restrictions Precautions Precautions: Fall Restrictions Weight Bearing Restrictions: No    Mobility  Bed Mobility               General bed mobility comments: oob in recliner  Transfers Overall transfer level: Needs assistance Equipment used: Rolling walker (2 wheeled) Transfers: Sit to/from Stand Sit to Stand: Supervision         General transfer comment: for safety. VCs hand placement  Ambulation/Gait Ambulation/Gait assistance: Min guard Gait Distance (Feet): 115 Feet Assistive device: Rolling walker (2 wheeled) Gait Pattern/deviations: Step-to pattern;Step-through pattern;Decreased stride length     General Gait Details: Close guard for safety. Slow gait speed. VCs safety, sequence   Stairs Stairs: Yes Min guard assist Stair Management: Step to pattern;Forwards;Two rails Number of Stairs: 5 General stair comments: up and over portable steps x 2. close guard for safety. VCs safety, sequence.    Wheelchair Mobility    Modified Rankin (Stroke Patients Only)       Balance Overall balance assessment: Mild deficits observed, not formally tested                                          Cognition Arousal/Alertness: Awake/alert Behavior During Therapy: WFL for tasks assessed/performed Overall Cognitive Status: Within Functional  Limits for tasks assessed                                        Exercises Total Joint Exercises Hip ABduction/ADduction: AROM;Right;10 reps;Standing Long Arc Quad: AROM;Right;5 reps;Seated Knee Flexion: AROM;Right;10 reps;Standing Marching in Standing: AROM;Both;10 reps;Standing General Exercises - Lower Extremity Heel Raises: AROM;Both;10 reps;Standing    General Comments        Pertinent Vitals/Pain Pain Assessment: 0-10 Pain Score: 7  Pain Location: R hip Pain Descriptors / Indicators: Sore Pain Intervention(s): Monitored during session    Home Living                      Prior Function            PT Goals (current goals can now be found in the care plan section) Progress towards PT goals: Progressing toward goals    Frequency    7X/week      PT Plan Current plan remains appropriate    Co-evaluation              AM-PAC PT "6 Clicks" Daily Activity  Outcome Measure  Difficulty turning over in bed (including adjusting bedclothes, sheets and blankets)?: A Little Difficulty moving from lying on back to sitting on the side of the bed? : A Little Difficulty sitting down on and standing up from a chair with arms (  e.g., wheelchair, bedside commode, etc,.)?: A Little Help needed moving to and from a bed to chair (including a wheelchair)?: A Little Help needed walking in hospital room?: A Little Help needed climbing 3-5 steps with a railing? : A Little 6 Click Score: 18    End of Session Equipment Utilized During Treatment: Gait belt Activity Tolerance: Patient tolerated treatment well Patient left: in chair;with call bell/phone within reach;with family/visitor present   Pain - Right/Left: Right Pain - part of body: Hip     Time: 2081-3887 PT Time Calculation (min) (ACUTE ONLY): 20 min  Charges:  $Gait Training: 8-22 mins                        Weston Anna, [place] Pager:  [phone] Office: [phone]

## 2018-07-08 ENCOUNTER — Telehealth: Payer: Self-pay

## 2018-07-08 NOTE — Telephone Encounter (Signed)
TCM follow up call made to patiemt . States he does not need to see Dr. Carollee Herter at this time. He is following up with Orthopedics.

## 2018-07-11 NOTE — Discharge Summary (Signed)
Physician Discharge Summary   Patient ID: Jeremy Dean. MRN: [id number] DOB/AGE: 02/13/1966 52 y.o.  Admit date: 07/04/2018 Discharge date: 07/05/2018  Primary Diagnosis: Osteoarthritis, right hip   Admission Diagnoses:  Past Medical History:  Diagnosis Date  . Arthritis   . Chronic kidney disease    kidney stone  . Genital warts    Hx of Genital warts  . History of blood clots    left leg after fracture 20+ years ago  . Hx of colonic polyps   . Hypertension    hx of years ago   . Migraines   . Non-alcoholic fatty liver disease    Discharge Diagnoses:   Principal Problem:   OA (osteoarthritis) of hip  Estimated body mass index is 35.51 kg/m as calculated from the following:   Height as of this encounter: _0  (1.803 m).   Weight as of this encounter: 115.5 kg.  Procedure:  Procedure(s) (LRB): RIGHT TOTAL HIP ARTHROPLASTY ANTERIOR APPROACH (Right)   Consults: None  HPI: Jeremy Jallow. is a 52 y.o. male who has advanced end-stage arthritis of their Right  hip with progressively worsening pain and dysfunction.The patient has failed nonoperative management and presents for total hip arthroplasty.   Laboratory Data: Admission on 07/04/2018, Discharged on 07/05/2018  Component Date Value Ref Range Status  . MRSA, PCR 07/04/2018 NEGATIVE  NEGATIVE Final  . Staphylococcus aureus 07/04/2018 POSITIVE* NEGATIVE Final   Comment: (NOTE) The Xpert SA Assay (FDA approved for NASAL specimens in patients 45 years of age and older), is one component of a comprehensive surveillance program. It is not intended to diagnose infection nor to guide or monitor treatment. Performed at [facility], [place] [address] [place], [place] [address]   . WBC 07/04/2018 7.9  4.0 - 10.5 K/uL Final  . RBC 07/04/2018 5.41  4.22 - 5.81 MIL/uL Final  . Hemoglobin 07/04/2018 16.2  13.0 - 17.0 g/dL Final  . HCT 07/04/2018 45.6  39.0 - 52.0 % Final  . MCV 07/04/2018 84.3   78.0 - 100.0 fL Final  . MCH 07/04/2018 29.9  26.0 - 34.0 pg Final  . MCHC 07/04/2018 35.5  30.0 - 36.0 g/dL Final  . RDW 07/04/2018 12.7  11.5 - 15.5 % Final  . Platelets 07/04/2018 213  150 - 400 K/uL Final   Performed at [facility], [place] [address], [place], [place] [address]  . Sodium 07/04/2018 141  135 - 145 mmol/L Final  . Potassium 07/04/2018 4.1  3.5 - 5.1 mmol/L Final  . Chloride 07/04/2018 107  98 - 111 mmol/L Final  . CO2 07/04/2018 25  22 - 32 mmol/L Final  . Glucose, Bld 07/04/2018 133* 70 - 99 mg/dL Final  . BUN 07/04/2018 12  6 - 20 mg/dL Final  . Creatinine, Ser 07/04/2018 0.96  0.61 - 1.24 mg/dL Final  . Calcium 07/04/2018 9.5  8.9 - 10.3 mg/dL Final  . Total Protein 07/04/2018 7.6  6.5 - 8.1 g/dL Final  . Albumin 07/04/2018 4.6  3.5 - 5.0 g/dL Final  . AST 07/04/2018 25  15 - 41 U/L Final  . ALT 07/04/2018 44  0 - 44 U/L Final  . Alkaline Phosphatase 07/04/2018 66  38 - 126 U/L Final  . Total Bilirubin 07/04/2018 1.4* 0.3 - 1.2 mg/dL Final  . GFR calc non Af Amer 07/04/2018 >60  >60 mL/min Final  . GFR calc Af Amer 07/04/2018 >60  >60 mL/min Final  Comment: (NOTE) The eGFR has been calculated using the CKD EPI equation. This calculation has not been validated in all clinical situations. eGFR's persistently <60 mL/min signify possible Chronic Kidney Disease.   Georgiann Hahn gap 07/04/2018 9  5 - 15 Final   Performed at [facility], [place] [address], [place], [place] [address]  . Prothrombin Time 07/04/2018 12.5  11.4 - 15.2 seconds Final  . INR 07/04/2018 0.94   Final   Performed at [facility], [place] [address] [place], [place] [address]  . aPTT 07/04/2018 31  24 - 36 seconds Final   Performed at [facility], [place] [address] [place], [place] [address]  . ABO/RH(D) 07/04/2018 O NEG   Final  . Antibody Screen 07/04/2018 NEG   Final  . Sample Expiration 07/04/2018    Final                    Value:07/07/2018 Performed at [facility], [place] [address] [place], [place] [address]   . ABO/RH(D) 07/04/2018    Final                   Value:O NEG Performed at [facility], [place] [address] [place], [place] [address]   . WBC 07/05/2018 14.3* 4.0 - 10.5 K/uL Final  . RBC 07/05/2018 4.54  4.22 - 5.81 MIL/uL Final  . Hemoglobin 07/05/2018 13.4  13.0 - 17.0 g/dL Final  . HCT 07/05/2018 38.8* 39.0 - 52.0 % Final  . MCV 07/05/2018 85.5  78.0 - 100.0 fL Final  . MCH 07/05/2018 29.5  26.0 - 34.0 pg Final  . MCHC 07/05/2018 34.5  30.0 - 36.0 g/dL Final  . RDW 07/05/2018 12.7  11.5 - 15.5 % Final  . Platelets 07/05/2018 231  150 - 400 K/uL Final   Performed at [facility], [place] [address] [place], [place] [address]  . Sodium 07/05/2018 135  135 - 145 mmol/L Final  . Potassium 07/05/2018 5.2* 3.5 - 5.1 mmol/L Final   Comment: DELTA CHECK NOTED NO VISIBLE HEMOLYSIS   . Chloride 07/05/2018 103  98 - 111 mmol/L Final  . CO2 07/05/2018 26  22 - 32 mmol/L Final  . Glucose, Bld 07/05/2018 206* 70 - 99 mg/dL Final  . BUN 07/05/2018 15  6 - 20 mg/dL Final  . Creatinine, Ser 07/05/2018 1.06  0.61 - 1.24 mg/dL Final  . Calcium 07/05/2018 9.0  8.9 - 10.3 mg/dL Final  . GFR calc non Af Amer 07/05/2018 >60  >60 mL/min Final  . GFR calc Af Amer 07/05/2018 >60  >60 mL/min Final   Comment: (NOTE) The eGFR has been calculated using the CKD EPI equation. This calculation has not been validated in all clinical situations. eGFR's persistently <60 mL/min signify possible Chronic Kidney Disease.   Georgiann Hahn gap 07/05/2018 6  5 - 15 Final   Performed at [facility], [place] [address], [place], [place] [address]     X-Rays:Dg Pelvis Portable  Result Date: 07/04/2018 CLINICAL DATA:  Hip replacement EXAM: PORTABLE PELVIS 1-2 VIEWS COMPARISON:  07/04/2018 FINDINGS: Status post right hip replacement with normal alignment. Pubic symphysis  and rami appear intact. Surgical drain over the right hip IMPRESSION: Status post right hip replacement with expected postsurgical changes. Electronically Signed   By: Donavan Foil M.D.   On: 07/04/2018 16:22   Dg C-arm 1-60 Min-no Report  Result Date: 07/04/2018 Fluoroscopy was  utilized by the requesting physician.  No radiographic interpretation.    EKG: Orders placed or performed during the hospital encounter of 11/02/13  . EKG 12-Lead  . EKG 12-Lead  . EKG     Hospital Course: Curren Mohrmann. is a 52 y.o. who was admitted to [facility]. They were brought to the operating room on 07/04/2018 and underwent Procedure(s): RIGHT TOTAL HIP ARTHROPLASTY ANTERIOR APPROACH.  Patient tolerated the procedure well and was later transferred to the recovery room and then to the orthopaedic floor for postoperative care. They were given PO and IV analgesics for pain control following their surgery. They were given 24 hours of postoperative antibiotics of  Anti-infectives (From admission, onward)   Start     Dose/Rate Route Frequency Ordered Stop   07/04/18 2000  ceFAZolin (ANCEF) IVPB 2g/100 mL premix     2 g 200 mL/hr over 30 Minutes Intravenous Every 6 hours 07/04/18 1719 07/05/18 0230   07/04/18 1145  ceFAZolin (ANCEF) IVPB 2g/100 mL premix     2 g 200 mL/hr over 30 Minutes Intravenous On call to O.R. 07/04/18 1136 07/04/18 1358     and started on DVT prophylaxis in the form of Xarelto.   PT and OT were ordered for total joint protocol. Discharge planning consulted to help with postop disposition and equipment needs.  Patient had a decent night on the evening of surgery. They started to get up OOB with therapy on POD #1. Pt was seen during rounds and was ready to go home pending progress with therapy. He noted having issues with intense muscle spasms, and muscle relaxer was switched to flexeril. Hemovac drain was pulled without difficulty. He worked with therapy on POD #1 and was meeting  his goals. Pt was discharged to home later that day in stable condition.  Diet: Renal diet Activity: WBAT Follow-up: in 1 week, patient is from [place] so will be following up prior to returning home. Disposition: Home with HEP Discharged Condition: stable   Discharge Instructions    Call MD / Call 911   Complete by:  As directed    If you experience chest pain or shortness of breath, CALL 911 and be transported to the hospital emergency room.  If you develope a fever above 101 F, pus (white drainage) or increased drainage or redness at the wound, or calf pain, call your surgeon's office.   Change dressing   Complete by:  As directed    You may change your dressing on Wednesday (07/06/2018), then change the dressing daily with sterile 4 x 4 inch gauze dressing and paper tape.   Constipation Prevention   Complete by:  As directed    Drink plenty of fluids.  Prune juice may be helpful.  You may use a stool softener, such as Colace (over the counter) 100 mg twice a day.  Use MiraLax (over the counter) for constipation as needed.   Diet - low sodium heart healthy   Complete by:  As directed    Discharge instructions   Complete by:  As directed    Dr. Gaynelle Arabian Total Joint Specialist [facility] [address] [place], [place] [address] [phone]  ANTERIOR APPROACH TOTAL HIP REPLACEMENT POSTOPERATIVE DIRECTIONS   Hip Rehabilitation, Guidelines Following Surgery  The results of a hip operation are greatly improved after range of motion and muscle strengthening exercises. Follow all safety measures which are given to protect your hip. If any of these exercises cause  increased pain or swelling in your joint, decrease the amount until you are comfortable again. Then slowly increase the exercises. Call your caregiver if you have problems or questions.   HOME CARE INSTRUCTIONS  Remove items at home which could result in a fall. This includes throw rugs or furniture in  walking pathways.  ICE to the affected hip every three hours for 30 minutes at a time and then as needed for pain and swelling.  Continue to use ice on the hip for pain and swelling from surgery. You may notice swelling that will progress down to the foot and ankle.  This is normal after surgery.  Elevate the leg when you are not up walking on it.   Continue to use the breathing machine which will help keep your temperature down.  It is common for your temperature to cycle up and down following surgery, especially at night when you are not up moving around and exerting yourself.  The breathing machine keeps your lungs expanded and your temperature down.  DIET You may resume your previous home diet once your are discharged from the hospital.  DRESSING / WOUND CARE / SHOWERING You may shower 3 days after surgery, but keep the wounds dry during showering.  You may use an occlusive plastic wrap (Press'n Seal for example), NO SOAKING/SUBMERGING IN THE BATHTUB.  If the bandage gets wet, change with a clean dry gauze.  If the incision gets wet, pat the wound dry with a clean towel. You may start showering once you are discharged home but do not submerge the incision under water. Just pat the incision dry and apply a dry gauze dressing on daily. Change the surgical dressing daily and reapply a dry dressing each time.  ACTIVITY Walk with your walker as instructed. Use walker as long as suggested by your caregivers. Avoid periods of inactivity such as sitting longer than an hour when not asleep. This helps prevent blood clots.  You may resume a sexual relationship in one month or when given the OK by your doctor.  You may return to work once you are cleared by your doctor.  Do not drive a car for 6 weeks or until released by you surgeon.  Do not drive while taking narcotics.  WEIGHT BEARING Weight bearing as tolerated with assist device (walker, cane, etc) as directed, use it as long as suggested by your  surgeon or therapist, typically at least 4-6 weeks.  POSTOPERATIVE CONSTIPATION PROTOCOL Constipation - defined medically as fewer than three stools per week and severe constipation as less than one stool per week.  One of the most common issues patients have following surgery is constipation.  Even if you have a regular bowel pattern at home, your normal regimen is likely to be disrupted due to multiple reasons following surgery.  Combination of anesthesia, postoperative narcotics, change in appetite and fluid intake all can affect your bowels.  In order to avoid complications following surgery, here are some recommendations in order to help you during your recovery period.  Colace (docusate) - Pick up an over-the-counter form of Colace or another stool softener and take twice a day as long as you are requiring postoperative pain medications.  Take with a full glass of water daily.  If you experience loose stools or diarrhea, hold the colace until you stool forms back up.  If your symptoms do not get better within 1 week or if they get worse, check with your doctor.  Dulcolax (bisacodyl) -  Pick up over-the-counter and take as directed by the product packaging as needed to assist with the movement of your bowels.  Take with a full glass of water.  Use this product as needed if not relieved by Colace only.   MiraLax (polyethylene glycol) - Pick up over-the-counter to have on hand.  MiraLax is a solution that will increase the amount of water in your bowels to assist with bowel movements.  Take as directed and can mix with a glass of water, juice, soda, coffee, or tea.  Take if you go more than two days without a movement. Do not use MiraLax more than once per day. Call your doctor if you are still constipated or irregular after using this medication for 7 days in a row.  If you continue to have problems with postoperative constipation, please contact the office for further assistance and  recommendations.  If you experience "the worst abdominal pain ever" or develop nausea or vomiting, please contact the office immediatly for further recommendations for treatment.  ITCHING  If you experience itching with your medications, try taking only a single pain pill, or even half a pain pill at a time.  You can also use Benadryl over the counter for itching or also to help with sleep.   TED HOSE STOCKINGS Wear the elastic stockings on both legs for three weeks following surgery during the day but you may remove then at night for sleeping.  MEDICATIONS See your medication summary on the "After Visit Summary" that the nursing staff will review with you prior to discharge.  You may have some home medications which will be placed on hold until you complete the course of blood thinner medication.  It is important for you to complete the blood thinner medication as prescribed by your surgeon.  Continue your approved medications as instructed at time of discharge.  PRECAUTIONS If you experience chest pain or shortness of breath - call 911 immediately for transfer to the hospital emergency department.  If you develop a fever greater that 101 F, purulent drainage from wound, increased redness or drainage from wound, foul odor from the wound/dressing, or calf pain - CONTACT YOUR SURGEON.                                                   FOLLOW-UP APPOINTMENTS Make sure you keep all of your appointments after your operation with your surgeon and caregivers. You should call the office at the above phone number and make an appointment for approximately two weeks after the date of your surgery or on the date instructed by your surgeon outlined in the "After Visit Summary".  RANGE OF MOTION AND STRENGTHENING EXERCISES  These exercises are designed to help you keep full movement of your hip joint. Follow your caregiver's or physical therapist's instructions. Perform all exercises about fifteen times, three  times per day or as directed. Exercise both hips, even if you have had only one joint replacement. These exercises can be done on a training (exercise) mat, on the floor, on a table or on a bed. Use whatever works the best and is most comfortable for you. Use music or television while you are exercising so that the exercises are a pleasant break in your day. This will make your life better with the exercises acting as a break in routine  you can look forward to.  Lying on your back, slowly slide your foot toward your buttocks, raising your knee up off the floor. Then slowly slide your foot back down until your leg is straight again.  Lying on your back spread your legs as far apart as you can without causing discomfort.  Lying on your side, raise your upper leg and foot straight up from the floor as far as is comfortable. Slowly lower the leg and repeat.  Lying on your back, tighten up the muscle in the front of your thigh (quadriceps muscles). You can do this by keeping your leg straight and trying to raise your heel off the floor. This helps strengthen the largest muscle supporting your knee.  Lying on your back, tighten up the muscles of your buttocks both with the legs straight and with the knee bent at a comfortable angle while keeping your heel on the floor.   IF YOU ARE TRANSFERRED TO A SKILLED REHAB FACILITY If the patient is transferred to a skilled rehab facility following release from the hospital, a list of the current medications will be sent to the facility for the patient to continue.  When discharged from the skilled rehab facility, please have the facility set up the patient's [place] prior to being released. Also, the skilled facility will be responsible for providing the patient with their medications at time of release from the facility to include their pain medication, the muscle relaxants, and their blood thinner medication. If the patient is still at the rehab  facility at time of the two week follow up appointment, the skilled rehab facility will also need to assist the patient in arranging follow up appointment in our office and any transportation needs.  MAKE SURE YOU:  Understand these instructions.  Get help right away if you are not doing well or get worse.    Pick up stool softner and laxative for home use following surgery while on pain medications. Do not submerge incision under water. Please use good hand washing techniques while changing dressing each day. May shower starting three days after surgery. Please use a clean towel to pat the incision dry following showers. Continue to use ice for pain and swelling after surgery. Do not use any lotions or creams on the incision until instructed by your surgeon.   Do not sit on low chairs, stoools or toilet seats, as it may be difficult to get up from low surfaces   Complete by:  As directed    Driving restrictions   Complete by:  As directed    No driving for two weeks   TED hose   Complete by:  As directed    Use stockings (TED hose) for three weeks on both leg(s).  You may remove them at night for sleeping.   Weight bearing as tolerated   Complete by:  As directed      Allergies as of 07/05/2018   No Known Allergies     Medication List    STOP taking these medications   ibuprofen 200 MG tablet Commonly known as:  ADVIL,MOTRIN   indomethacin 25 MG capsule Commonly known as:  INDOCIN     TAKE these medications   cyclobenzaprine 10 MG tablet Commonly known as:  FLEXERIL Take 1 tablet (10 mg total) by mouth 3 (three) times daily as needed for muscle spasms.   oxyCODONE 5 MG immediate release tablet Commonly known as:  Oxy IR/ROXICODONE Take 1-2 tablets (5-10  mg total) by mouth every 6 (six) hours as needed for moderate pain (pain score 4-6).   rivaroxaban 10 MG Tabs tablet Commonly known as:  XARELTO Take 1 tablet (10 mg total) by mouth daily with breakfast for 20 days.  Then take one 81 mg aspirin once a day for three weeks. Then discontinue aspirin.   traMADol 50 MG tablet Commonly known as:  ULTRAM Take 50 mg by mouth every 6 (six) hours as needed (for pain.).            Discharge Care Instructions  (From admission, onward)         Start     Ordered   07/05/18 0000  Weight bearing as tolerated     07/05/18 0742   07/05/18 0000  Change dressing    Comments:  You may change your dressing on Wednesday (07/06/2018), then change the dressing daily with sterile 4 x 4 inch gauze dressing and paper tape.   07/05/18 1655         Follow-up Information    Gaynelle Arabian, MD. Schedule an appointment as soon as possible for a visit on 07/19/2018.   Specialty:  Orthopedic Surgery Contact information: [address] [place] [address] [phone]           Signed: Theresa Duty, PA-C Orthopedic Surgery 07/11/2018, 8:56 AM

## 2019-02-11 IMAGING — DX DG PORTABLE PELVIS
1 series · 1 of 1 positions shown · non-contrast
Comparison: 07/04/2018

CLINICAL DATA: Hip replacement

EXAM:
PORTABLE PELVIS 1-2 VIEWS

[pelvis ap]
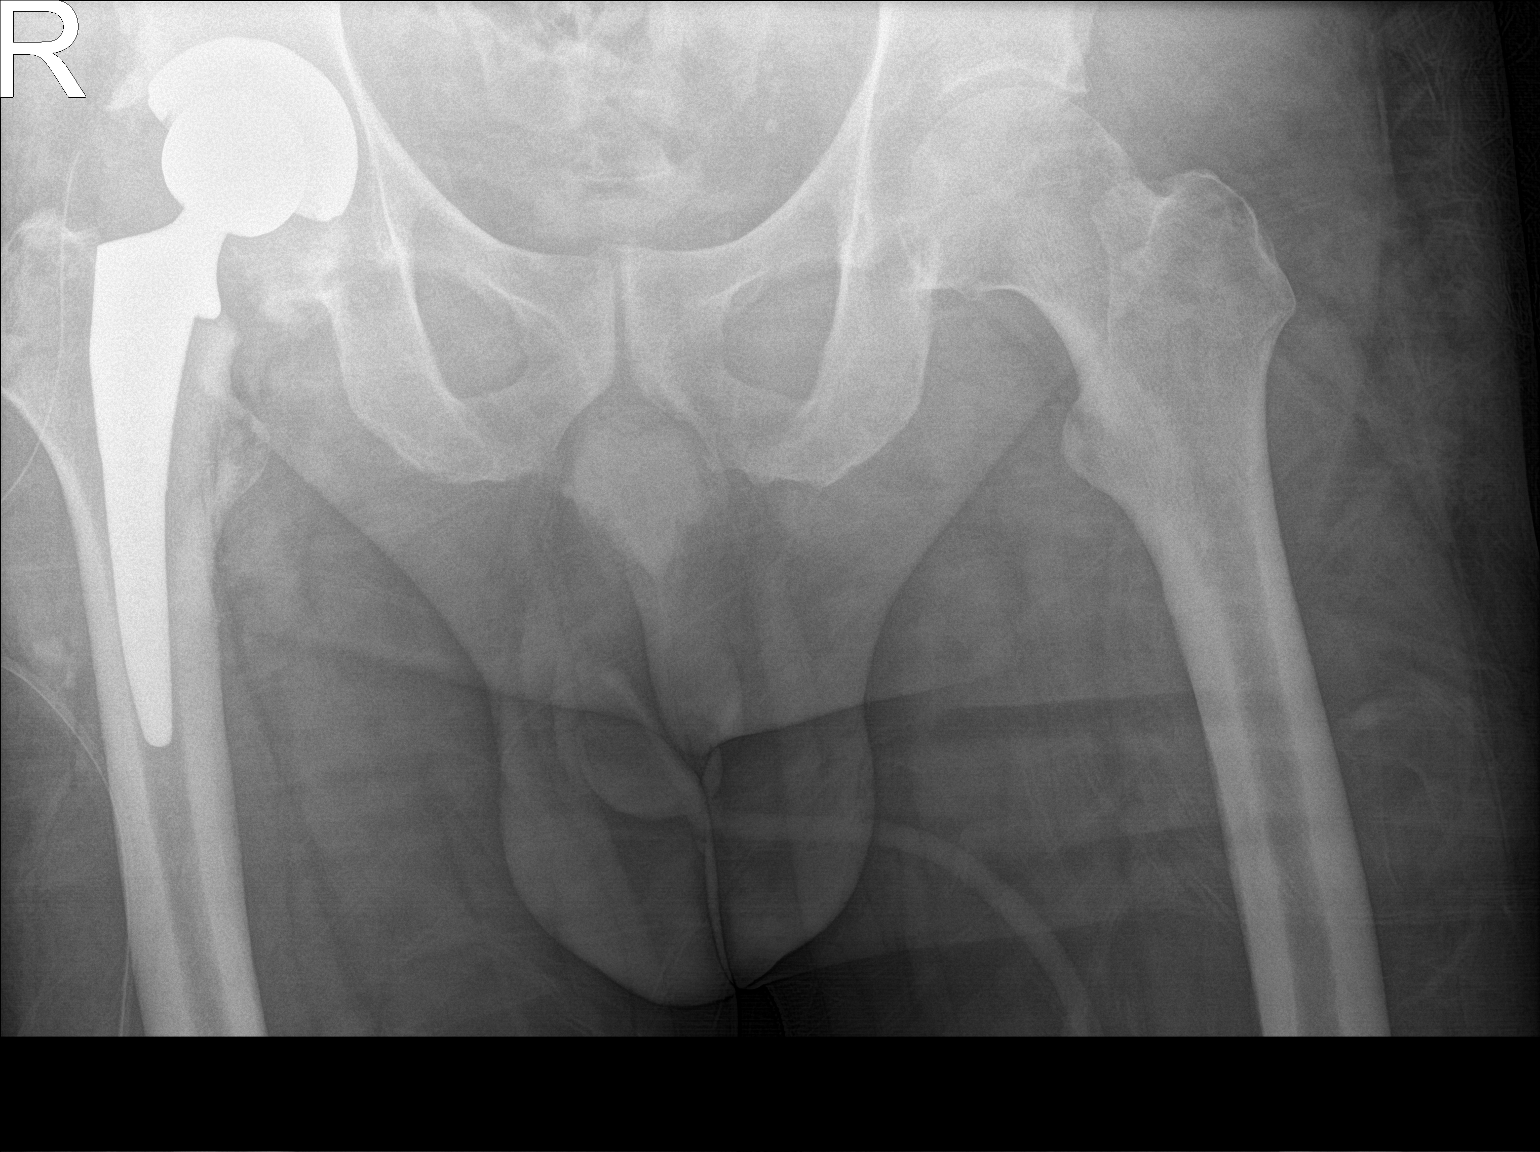

[1 of 1 positions shown; findings below may reference images not displayed]

FINDINGS: Status post right hip replacement with normal alignment. Pubic
symphysis and rami appear intact. Surgical drain over the right hip
IMPRESSION: Status post right hip replacement with expected postsurgical
changes.

## 2021-05-14 ENCOUNTER — Encounter: Payer: Self-pay | Admitting: Gastroenterology

## 2021-07-14 ENCOUNTER — Encounter: Payer: Self-pay | Admitting: Gastroenterology

## 2021-08-07 ENCOUNTER — Ambulatory Visit (AMBULATORY_SURGERY_CENTER): Payer: Managed Care, Other (non HMO) | Admitting: *Deleted

## 2021-08-07 ENCOUNTER — Other Ambulatory Visit: Payer: Self-pay

## 2021-08-07 VITALS — Ht 71.0 in | Wt 250.0 lb

## 2021-08-07 DIAGNOSIS — Z86010 Personal history of colonic polyps: Secondary | ICD-10-CM

## 2021-08-07 DIAGNOSIS — Z800 Family history of malignant neoplasm of digestive organs: Secondary | ICD-10-CM

## 2021-08-07 MED ORDER — PEG-KCL-NACL-NASULF-NA ASC-C 100 G PO SOLR: 1.0000 | Freq: Once | ORAL | 0 refills | Status: AC

## 2021-08-07 NOTE — Progress Notes (Signed)
Virtual pre visit completed over telephone. Instructions forwarded through MyChart and secure e-mail [email or web address]   No egg or soy allergy known to patient  No issues known to pt with past sedation with any surgeries or procedures Patient denies ever being told they had issues or difficulty with intubation  No FH of Malignant Hyperthermia Pt is not on diet pills Pt is not on  home 02  Pt is not on blood thinners  Pt denies issues with constipation  No A fib or A flutter  Pt is fully vaccinated  for Covid     Due to the COVID-19 pandemic we are asking patients to follow certain guidelines in [place] and the [place]   Pt aware of COVID protocols and [facility] guidelines

## 2021-08-14 ENCOUNTER — Encounter: Payer: Self-pay | Admitting: Gastroenterology

## 2021-08-27 ENCOUNTER — Telehealth: Payer: Self-pay

## 2021-08-27 DIAGNOSIS — Z86010 Personal history of colonic polyps: Secondary | ICD-10-CM

## 2021-08-27 DIAGNOSIS — Z800 Family history of malignant neoplasm of digestive organs: Secondary | ICD-10-CM

## 2021-08-27 NOTE — Telephone Encounter (Signed)
Called and spoke with patient in regards to information below. He is aware that we have cancelled his [place] colonoscopy that was scheduled for 09/02/21. Pt is aware that we will contact him once we have an available hospital date, he is aware that this may be next year. Pt verbalized understanding of all information and had no concerns at the end of the call.

## 2021-08-27 NOTE — Telephone Encounter (Signed)
-----   Message from Osvaldo Angst, CRNA sent at 08/27/2021  6:01 AM EST ----- Regarding: [facility] pt Dr. [name] ,  This pt is scheduled with on 11/29.  He is a difficult intubation and his procedure will need to be done at the hospital.  Thanks,  Osvaldo Angst

## 2021-09-01 ENCOUNTER — Other Ambulatory Visit: Payer: Self-pay

## 2021-09-01 DIAGNOSIS — Z860100 Personal history of colon polyps, unspecified: Secondary | ICD-10-CM

## 2021-09-01 DIAGNOSIS — Z800 Family history of malignant neoplasm of digestive organs: Secondary | ICD-10-CM

## 2021-09-01 NOTE — Telephone Encounter (Signed)
Please offer this patient my next available [facility] dates, which are 11/24/21 and 11/25/21 during my hospital backup week.  - HD

## 2021-09-01 NOTE — Telephone Encounter (Signed)
Called and spoke with patient. He is scheduled for a colonoscopy at [facility] on Monday 2/20 at 11:15am. Pt is aware that he needs to arrive at [facility] by 9:45 am with a care partner. Pt is aware that we will mail him new instructions. Pt confirmed new address on file.   Ambulatory GI referral in epic

## 2021-09-02 ENCOUNTER — Encounter: Payer: Managed Care, Other (non HMO) | Admitting: Gastroenterology

## 2021-11-14 ENCOUNTER — Other Ambulatory Visit: Payer: Self-pay

## 2021-11-14 ENCOUNTER — Encounter (HOSPITAL_COMMUNITY): Payer: Self-pay | Admitting: Gastroenterology

## 2021-11-19 ENCOUNTER — Telehealth: Payer: Self-pay | Admitting: Gastroenterology

## 2021-11-19 DIAGNOSIS — Z86010 Personal history of colonic polyps: Secondary | ICD-10-CM

## 2021-11-19 DIAGNOSIS — Z800 Family history of malignant neoplasm of digestive organs: Secondary | ICD-10-CM

## 2021-11-19 MED ORDER — PEG-KCL-NACL-NASULF-NA ASC-C 100 G PO SOLR: 1.0000 | Freq: Once | ORAL | 0 refills | Status: DC

## 2021-11-19 MED ORDER — PEG-KCL-NACL-NASULF-NA ASC-C 100 G PO SOLR: 1.0000 | Freq: Once | ORAL | 0 refills | Status: AC

## 2021-11-19 NOTE — Telephone Encounter (Addendum)
Resent Movi prep Rx to the patient's pharmacy. Called pt-no answer. Voice mail box is full.

## 2021-11-19 NOTE — Telephone Encounter (Signed)
Movi prep Rx resent to patient's requested pharmacy-he will check [facility] the [facility] to make sure if Moviprep is available-if not he will call us back today or tomorrow!

## 2021-11-19 NOTE — Telephone Encounter (Signed)
Inbound call from patient states that he is not  sure which pharmacy his prep medication was sent to. Seeking advice, please advise. Procedure is 2/20.

## 2021-11-24 ENCOUNTER — Ambulatory Visit (HOSPITAL_COMMUNITY)
Admission: RE | Admit: 2021-11-24 | Discharge: 2021-11-24 | Disposition: A | Discharge status: Home / Self Care | Payer: Managed Care, Other (non HMO) | Attending: Gastroenterology | Admitting: Gastroenterology

## 2021-11-24 ENCOUNTER — Other Ambulatory Visit: Payer: Self-pay

## 2021-11-24 ENCOUNTER — Encounter (HOSPITAL_COMMUNITY)
Admission: RE | Disposition: A | Discharge status: Home / Self Care | Payer: Self-pay | Source: Home / Self Care | Attending: Gastroenterology

## 2021-11-24 ENCOUNTER — Encounter (HOSPITAL_COMMUNITY): Payer: Self-pay | Admitting: Gastroenterology

## 2021-11-24 ENCOUNTER — Ambulatory Visit (HOSPITAL_COMMUNITY): Payer: Managed Care, Other (non HMO) | Admitting: Anesthesiology

## 2021-11-24 ENCOUNTER — Ambulatory Visit (HOSPITAL_BASED_OUTPATIENT_CLINIC_OR_DEPARTMENT_OTHER): Payer: Managed Care, Other (non HMO) | Admitting: Anesthesiology

## 2021-11-24 DIAGNOSIS — I129 Hypertensive chronic kidney disease with stage 1 through stage 4 chronic kidney disease, or unspecified chronic kidney disease: Secondary | ICD-10-CM | POA: Diagnosis not present

## 2021-11-24 DIAGNOSIS — Z86010 Personal history of colonic polyps: Secondary | ICD-10-CM | POA: Insufficient documentation

## 2021-11-24 DIAGNOSIS — Z800 Family history of malignant neoplasm of digestive organs: Secondary | ICD-10-CM | POA: Insufficient documentation

## 2021-11-24 DIAGNOSIS — K635 Polyp of colon: Secondary | ICD-10-CM

## 2021-11-24 DIAGNOSIS — N289 Disorder of kidney and ureter, unspecified: Secondary | ICD-10-CM

## 2021-11-24 DIAGNOSIS — Z87891 Personal history of nicotine dependence: Secondary | ICD-10-CM | POA: Diagnosis not present

## 2021-11-24 DIAGNOSIS — D123 Benign neoplasm of transverse colon: Secondary | ICD-10-CM | POA: Insufficient documentation

## 2021-11-24 DIAGNOSIS — Z7984 Long term (current) use of oral hypoglycemic drugs: Secondary | ICD-10-CM | POA: Insufficient documentation

## 2021-11-24 DIAGNOSIS — I10 Essential (primary) hypertension: Secondary | ICD-10-CM

## 2021-11-24 DIAGNOSIS — K760 Fatty (change of) liver, not elsewhere classified: Secondary | ICD-10-CM | POA: Insufficient documentation

## 2021-11-24 DIAGNOSIS — E1122 Type 2 diabetes mellitus with diabetic chronic kidney disease: Secondary | ICD-10-CM | POA: Diagnosis not present

## 2021-11-24 DIAGNOSIS — N189 Chronic kidney disease, unspecified: Secondary | ICD-10-CM | POA: Diagnosis not present

## 2021-11-24 DIAGNOSIS — D120 Benign neoplasm of cecum: Secondary | ICD-10-CM | POA: Diagnosis not present

## 2021-11-24 DIAGNOSIS — Z86718 Personal history of other venous thrombosis and embolism: Secondary | ICD-10-CM | POA: Diagnosis not present

## 2021-11-24 HISTORY — PX: POLYPECTOMY: SHX5525

## 2021-11-24 HISTORY — PX: COLONOSCOPY WITH PROPOFOL: SHX5780

## 2021-11-24 HISTORY — DX: Failed or difficult intubation, initial encounter: T88.4XXA

## 2021-11-24 LAB — GLUCOSE, CAPILLARY: Glucose-Capillary: 173 mg/dL — ABNORMAL HIGH (ref 70–99)

## 2021-11-24 SURGERY — COLONOSCOPY WITH PROPOFOL
Anesthesia: Monitor Anesthesia Care

## 2021-11-24 MED ORDER — LIDOCAINE 2% (20 MG/ML) 5 ML SYRINGE
INTRAMUSCULAR | Status: DC | PRN
Start: 2021-11-24 — End: 2021-11-24
  Administered 2021-11-24: 100 mg via INTRAVENOUS

## 2021-11-24 MED ORDER — MIDAZOLAM HCL 5 MG/5ML IJ SOLN
INTRAMUSCULAR | Status: DC | PRN
  Administered 2021-11-24 (×2): 1 mg via INTRAVENOUS

## 2021-11-24 MED ORDER — PROPOFOL 500 MG/50ML IV EMUL
INTRAVENOUS | Status: DC | PRN
  Administered 2021-11-24: 150 ug/kg/min via INTRAVENOUS

## 2021-11-24 MED ORDER — PROPOFOL 10 MG/ML IV BOLUS
INTRAVENOUS | Status: DC | PRN
  Administered 2021-11-24: 30 mg via INTRAVENOUS

## 2021-11-24 MED ORDER — MIDAZOLAM HCL 2 MG/2ML IJ SOLN
INTRAMUSCULAR | Status: AC
  Filled 2021-11-24: qty 2

## 2021-11-24 MED ORDER — PROPOFOL 500 MG/50ML IV EMUL
INTRAVENOUS | Status: AC
  Filled 2021-11-24: qty 100

## 2021-11-24 MED ORDER — PROPOFOL 1000 MG/100ML IV EMUL
INTRAVENOUS | Status: AC
  Filled 2021-11-24: qty 200

## 2021-11-24 MED ORDER — LACTATED RINGERS IV SOLN: INTRAVENOUS | Status: DC | PRN

## 2021-11-24 SURGICAL SUPPLY — 22 items

## 2021-11-24 NOTE — Discharge Instructions (Addendum)

## 2021-11-24 NOTE — Op Note (Signed)
[facility] Patient Name: Jeremy Dean Procedure Date: 11/24/2021 MRN: [id number] Attending MD: Estill Cotta. [name] , MD Date of Birth: 12-Apr-1966 CSN: [id number] Age: 56 Admit Type: Outpatient Procedure:                Colonoscopy Indications:              Surveillance: Personal history of adenomatous                            polyps on last colonoscopy > 3 years ago                           <49m TA and SSP x 4 last colonoscopy July 2019                           father had colon cancer Providers:                HMallie MusselL. DLoletha Carrow MD, JAllayne Gitelman RN, HFrazier Richards                            Technician Referring MD:             KWylie Hail MD Medicines:                Monitored Anesthesia Care Complications:            No immediate complications. Estimated Blood Loss:     Estimated blood loss was minimal. Procedure:                Pre-Anesthesia Assessment:                           - Prior to the procedure, a History and Physical                            was performed, and patient medications and                            allergies were reviewed. The patient's tolerance of                            previous anesthesia was also reviewed. The risks                            and benefits of the procedure and the sedation                            options and risks were discussed with the patient.                            All questions were answered, and informed consent                            was obtained. Prior Anticoagulants: The patient has  taken no previous anticoagulant or antiplatelet                            agents. ASA Grade Assessment: III - A patient with                            severe systemic disease. After reviewing the risks                            and benefits, the patient was deemed in                            satisfactory condition to undergo the procedure.                           After obtaining informed  consent, the colonoscope                            was passed under direct vision. Throughout the                            procedure, the patient's blood pressure, pulse, and                            oxygen saturations were monitored continuously. The                            CF-HQ190L ([id number] Olympus colonoscope was                            introduced through the anus and advanced to the the                            cecum, identified by appendiceal orifice and                            ileocecal valve. The colonoscopy was performed                            without difficulty. The patient tolerated the                            procedure well. The quality of the bowel                            preparation was excellent. The ileocecal valve,                            appendiceal orifice, and rectum were photographed. Scope In: 10:52:54 AM Scope Out: 11:17:40 AM Scope Withdrawal Time: 0 hours 21 minutes 59 seconds  Total Procedure Duration: 0 hours 24 minutes 46 seconds  Findings:      The perianal and digital rectal examinations were normal.      Second look right colon with NBI performed.  Two sessile polyps were found in the cecum. The polyps were 4 to 6 mm in       size. These polyps were removed with a cold snare. Resection and       retrieval were complete.      An 8-10 mm polyp was found in the transverse colon. The polyp was       semi-sessile. The polyp was removed with a cold snare. Resection and       retrieval were complete.      The exam was otherwise without abnormality on direct and retroflexion       views. Impression:               - Two 4 to 6 mm polyps in the cecum, removed with a                            cold snare. Resected and retrieved.                           - One 8-10 mm polyp in the transverse colon,                            removed with a cold snare. Resected and retrieved.                           - The examination was otherwise  normal on direct                            and retroflexion views. Moderate Sedation:      MAC sedation used Recommendation:           - Patient has a contact number available for                            emergencies. The signs and symptoms of potential                            delayed complications were discussed with the                            patient. Return to normal activities tomorrow.                            Written discharge instructions were provided to the                            patient.                           - Resume previous diet.                           - Await pathology results.                           - Repeat colonoscopy is recommended for  surveillance. The colonoscopy date will be                            determined after pathology results from today's                            exam become available for review. Procedure Code(s):        --- Professional ---                           [phone], Colonoscopy, flexible; with removal of                            tumor(s), polyp(s), or other lesion(s) by snare                            technique Diagnosis Code(s):        --- Professional ---                           K63.5, Polyp of colon                           Z86.010, Personal history of colonic polyps CPT copyright 2019 [employer]. All rights reserved. The codes documented in this report are preliminary and upon coder review may  be revised to meet current compliance requirements. Perri Lamagna L. [name], MD 11/24/2021 11:26:12 AM This report has been signed electronically. Number of Addenda: 0

## 2021-11-24 NOTE — Interval H&P Note (Signed)
History and Physical Interval Note:  11/24/2021 10:42 AM  Jeremy Dean.  has presented today for surgery, with the diagnosis of family hx of colon cancer, hx of colon polyps.  The various methods of treatment have been discussed with the patient and family. After consideration of risks, benefits and other options for treatment, the patient has consented to  Procedure(s): COLONOSCOPY WITH PROPOFOL (N/A) as a surgical intervention.  The patient's history has been reviewed, patient examined, no change in status, stable for surgery.  I have reviewed the patient's chart and labs.  Questions were answered to the patient's satisfaction.    This patient's hypertension improved in the preop area after dose of Versed.  He continues to feel well, anesthesia has been evaluating him and we will proceed with colonoscopy.  I spoke with [name] and recommended that he contact his primary care provider later today to be seen in the office soon for evaluation of blood pressure.  I also recommended that he get an automated blood pressure cuff at a local pharmacy and check his blood pressure twice a day, keeping a log of that so he can present it to primary care as well.   Nelida Meuse III

## 2021-11-24 NOTE — Anesthesia Preprocedure Evaluation (Addendum)
Anesthesia Evaluation  Patient identified by MRN, date of birth, ID band Patient awake    Reviewed: Allergy & Precautions, NPO status , Patient's Chart, lab work & pertinent test results  History of Anesthesia Complications (+) DIFFICULT AIRWAY and history of anesthetic complications (2 tries with McGrath)  Airway Mallampati: III  TM Distance: >3 FB Neck ROM: Full    Dental  (+) Dental Advisory Given, Teeth Intact   Pulmonary former smoker,    breath sounds clear to auscultation       Cardiovascular hypertension (not on meds presently),  Rhythm:Regular Rate:Normal     Neuro/Psych negative neurological ROS     GI/Hepatic NASH Colon cancer   Endo/Other  diabetes (glu 173), Oral Hypoglycemic Agentsobese  Renal/GU Renal InsufficiencyRenal disease     Musculoskeletal   Abdominal (+) + obese,   Peds  Hematology negative hematology ROS (+)   Anesthesia Other Findings   Reproductive/Obstetrics                            Anesthesia Physical Anesthesia Plan  ASA: 3  Anesthesia Plan: MAC   Post-op Pain Management: Minimal or no pain anticipated   Induction:   PONV Risk Score and Plan: 1 and Treatment may vary due to age or medical condition  Airway Management Planned: Simple Face Mask and Natural Airway  Additional Equipment: None  Intra-op Plan:   Post-operative Plan:   Informed Consent: I have reviewed the patients History and Physical, chart, labs and discussed the procedure including the risks, benefits and alternatives for the proposed anesthesia with the patient or authorized representative who has indicated his/her understanding and acceptance.     Dental advisory given  Plan Discussed with: CRNA and Surgeon  Anesthesia Plan Comments:        Anesthesia Quick Evaluation

## 2021-11-24 NOTE — Transfer of Care (Signed)
Immediate Anesthesia Transfer of Care Note  Patient: Bienvenido Proehl.  Procedure(s) Performed: COLONOSCOPY WITH PROPOFOL POLYPECTOMY BIOPSY  Patient Location: PACU  Anesthesia Type:MAC  Level of Consciousness: drowsy  Airway & Oxygen Therapy: Patient Spontanous Breathing and Patient connected to face mask oxygen  Post-op Assessment: Report given to RN and Post -op Vital signs reviewed and stable  Post vital signs: Reviewed and stable  Last Vitals:  Vitals Value Taken Time  BP    Temp    Pulse 80 11/24/21 1124  Resp 19 11/24/21 1124  SpO2 93 % 11/24/21 1124  Vitals shown include unvalidated device data.  Last Pain:  Vitals:   11/24/21 1001  TempSrc: Oral  PainSc: 0-No pain         Complications: No notable events documented.

## 2021-11-24 NOTE — H&P (Signed)
History and Physical:  This patient presents for endoscopic testing for: History of colon polyps  This is a 56 year old man with a history of adenomatous colon polyps in July 2019 and a family history of colon cancer here for surveillance colonoscopy.  His procedures being done in the hospital outpatient endoscopy lab due to a history of possible difficult intubation during a prior surgery at an outside hospital. He denies any recent changes in bowel habits or rectal bleeding. He says he had a physical with primary care about 2 months ago and believes his blood pressure was normal.  He is hypertensive today, blood pressure was 144/110, and just now was 180/120.he denies headache, visual disturbance chest pain or dyspnea.  Anesthesia has evaluated this patient and is giving him a dose of Versed to see if that improves his blood pressure prior to proceeding with the colonoscopy.  ROS: Patient denies chest pain or shortness of breath   Past Medical History: Past Medical History:  Diagnosis Date   Arthritis    Bronchitis    Chronic kidney disease    kidney stone   Diabetes mellitus without complication ([place])    Difficult intubation    Genital warts    Hx of Genital warts   History of blood clots    left leg after fracture 20+ years ago   Hx of colonic polyps    Hypertension    hx of years ago    Migraines    Non-alcoholic fatty liver disease      Past Surgical History: Past Surgical History:  Procedure Laterality Date   COLONOSCOPY     POLYPECTOMY     TOTAL HIP ARTHROPLASTY Right 07/04/2018   Procedure: RIGHT TOTAL HIP ARTHROPLASTY ANTERIOR APPROACH;  Surgeon: Gaynelle Arabian, MD;  Location: [facility];  Service: Orthopedics;  Laterality: Right;   WISDOM TOOTH EXTRACTION      Allergies: No Known Allergies  Outpatient Meds: No current facility-administered medications for this encounter.      ___________________________________________________________________ Objective    Exam:  BP (!) 144/110    Pulse 87    Temp 98.7 F (37.1 C) (Oral)    Resp 16    Ht 5\' 11"  (1.803 m)    Wt 111.1 kg    SpO2 97%    BMI 34.17 kg/m   CV: RRR without murmur, S1/S2 Resp: clear to auscultation bilaterally, normal RR and effort noted GI: soft, no tenderness, with active bowel sounds.   Assessment:   Plan: Colonoscopy  The benefits and risks of the planned procedure were described in detail with the patient or (when appropriate) their health care proxy.  Risks were outlined as including, but not limited to, bleeding, infection, perforation, adverse medication reaction leading to cardiac or pulmonary decompensation, pancreatitis (if ERCP).  The limitation of incomplete mucosal visualization was also discussed.  No guarantees or warranties were given.  Further observation in preoperative area to see if hypertension improves enough to allow colonoscopy today.   Wilfrid Lund, MD

## 2021-11-24 NOTE — Anesthesia Postprocedure Evaluation (Signed)
Anesthesia Post Note  Patient: Jeremy Dean.  Procedure(s) Performed: COLONOSCOPY WITH PROPOFOL POLYPECTOMY     Patient location during evaluation: Endoscopy Anesthesia Type: MAC Level of consciousness: awake and alert, patient cooperative and oriented Pain management: pain level controlled Vital Signs Assessment: post-procedure vital signs reviewed and stable Respiratory status: nonlabored ventilation, spontaneous breathing and respiratory function stable Cardiovascular status: blood pressure returned to baseline and stable Postop Assessment: no apparent nausea or vomiting Anesthetic complications: no Comments: Pt understands to F/U HTN with his primary care MD   No notable events documented.  Last Vitals:  Vitals:   11/24/21 1124 11/24/21 1130  BP: (!) 126/92 (!) 124/91  Pulse: 80 76  Resp: 19 19  Temp:  (!) 36.2 C  SpO2: 97% 98%    Last Pain:  Vitals:   11/24/21 1130  TempSrc: Temporal  PainSc: 0-No pain                 Raevyn Sokol,E. Cosimo Schertzer

## 2021-11-25 ENCOUNTER — Encounter (HOSPITAL_COMMUNITY): Payer: Self-pay | Admitting: Gastroenterology

## 2021-11-25 ENCOUNTER — Encounter: Payer: Self-pay | Admitting: Gastroenterology

## 2021-11-25 LAB — SURGICAL PATHOLOGY
